# Patient Record
Sex: Female | Born: 1975 | Race: White | Hispanic: Yes | Marital: Single | Smoking: Never smoker
Health system: Southern US, Community
[De-identification: ages and names within clinical notes are randomized; demographics above are authoritative.]

## PROBLEM LIST (undated history)

## (undated) DIAGNOSIS — F419 Anxiety disorder, unspecified: Secondary | ICD-10-CM

## (undated) DIAGNOSIS — T7840XA Allergy, unspecified, initial encounter: Secondary | ICD-10-CM

## (undated) DIAGNOSIS — F329 Major depressive disorder, single episode, unspecified: Secondary | ICD-10-CM

## (undated) DIAGNOSIS — F32A Depression, unspecified: Secondary | ICD-10-CM

## (undated) DIAGNOSIS — J45909 Unspecified asthma, uncomplicated: Secondary | ICD-10-CM

## (undated) HISTORY — DX: Depression, unspecified: F32.A

## (undated) HISTORY — DX: Major depressive disorder, single episode, unspecified: F32.9

## (undated) HISTORY — DX: Allergy, unspecified, initial encounter: T78.40XA

## (undated) HISTORY — DX: Anxiety disorder, unspecified: F41.9

## (undated) HISTORY — DX: Unspecified asthma, uncomplicated: J45.909

---

## 1999-09-25 HISTORY — PX: TUBAL LIGATION: SHX77

## 2006-07-23 ENCOUNTER — Encounter: Admission: RE | Admit: 2006-07-23 | Discharge: 2006-07-23 | Payer: Self-pay | Admitting: Family Medicine

## 2011-04-20 ENCOUNTER — Ambulatory Visit (INDEPENDENT_AMBULATORY_CARE_PROVIDER_SITE_OTHER): Payer: Self-pay | Admitting: Gynecology

## 2011-04-20 ENCOUNTER — Encounter: Payer: Self-pay | Admitting: Gynecology

## 2011-04-20 VITALS — BP 130/78 | Ht 61.75 in | Wt 161.0 lb

## 2011-04-20 DIAGNOSIS — K629 Disease of anus and rectum, unspecified: Secondary | ICD-10-CM

## 2011-04-20 DIAGNOSIS — Z124 Encounter for screening for malignant neoplasm of cervix: Secondary | ICD-10-CM

## 2011-04-20 DIAGNOSIS — K6289 Other specified diseases of anus and rectum: Secondary | ICD-10-CM

## 2011-04-20 MED ORDER — LIDOCAINE-HYDROCORTISONE ACE 3-0.5 % RE CREA: 1.0000 | TOPICAL_CREAM | Freq: Three times a day (TID) | RECTAL | Status: DC

## 2011-04-20 NOTE — Progress Notes (Signed)
Patient is a 35 year old female patient knew patient in the practice who presented to the office for a problem visit. She stated over the course of the past 6 months she's noticed this lesion in her right and she had visited in urgent care was given a cream. She is concerned afraid of it being something malignant or STD. She is in a monogamous relationship should and is having normal menstrual cycle and has had a tubal ligation the past.  Pelvic exam: Bartholin urethra Skene's glands within normal limits Normal perineum, perirectal induration/hemorrhoidal tag? With a small area of erosion. No internal rectal abnormality on digital exam. And no vaginal abnormalities noted.  The perirectal region was cleansed with Betadine solution and 1% lidocaine was applied subdermally for approximately 3 cc. With a scalpel this indurated area was excised and submitted for pathological evaluation. For hemostasis overnight and was applied followed by application of 2% Xylocaine gel.  Patient will be notified of the pathology report within one week and she'll return back to the office in 3 weeks for her annual gynecological examination. I've given her a prescription for [facility] cream to apply to 3 times a day for the next 7-10 days. She was also given a prescription for Colace to take one tablet daily as a stool softener. If she does not hear from the path report in 1 week from our office she is to call back for the results.

## 2011-04-20 NOTE — Patient Instructions (Signed)
Hacer cita para ver al Dr. Lily Peer in [place]. Applicar crema rectal 2-3 veces al dai for [employer] a diez dias. Te llamaremoscon el resultado en una semana. Si no recibes llamada de nosotros por favor llamar para el resultado.

## 2011-04-24 ENCOUNTER — Telehealth: Payer: Self-pay | Admitting: Anesthesiology

## 2011-04-24 NOTE — Telephone Encounter (Signed)
Called patient and informed that her pathology report came back negative.Marland Kitchen Her biopsy was benign.. And to remind her to keep her appointment for a follow-up check of biopsy site. All was explained in Spanish advised to call back with any additional questions.Marland Kitchen

## 2011-05-09 ENCOUNTER — Ambulatory Visit: Payer: Self-pay | Admitting: Gynecology

## 2011-05-16 ENCOUNTER — Encounter: Payer: Self-pay | Admitting: Gynecology

## 2011-05-16 ENCOUNTER — Ambulatory Visit (INDEPENDENT_AMBULATORY_CARE_PROVIDER_SITE_OTHER): Payer: Self-pay | Admitting: Gynecology

## 2011-05-16 ENCOUNTER — Other Ambulatory Visit (HOSPITAL_COMMUNITY)
Admission: RE | Admit: 2011-05-16 | Discharge: 2011-05-16 | Disposition: A | Discharge status: Home / Self Care | Payer: Self-pay | Source: Ambulatory Visit | Attending: Gynecology | Admitting: Gynecology

## 2011-05-16 VITALS — BP 118/76 | Ht 62.5 in | Wt 163.0 lb

## 2011-05-16 DIAGNOSIS — Z01419 Encounter for gynecological examination (general) (routine) without abnormal findings: Secondary | ICD-10-CM | POA: Insufficient documentation

## 2011-05-16 DIAGNOSIS — Z13220 Encounter for screening for lipoid disorders: Secondary | ICD-10-CM

## 2011-05-16 DIAGNOSIS — K6289 Other specified diseases of anus and rectum: Secondary | ICD-10-CM

## 2011-05-16 DIAGNOSIS — R823 Hemoglobinuria: Secondary | ICD-10-CM

## 2011-05-16 DIAGNOSIS — R635 Abnormal weight gain: Secondary | ICD-10-CM

## 2011-05-16 DIAGNOSIS — K629 Disease of anus and rectum, unspecified: Secondary | ICD-10-CM

## 2011-05-16 DIAGNOSIS — Z131 Encounter for screening for diabetes mellitus: Secondary | ICD-10-CM

## 2011-05-16 NOTE — Progress Notes (Signed)
Caroline Carter January 14, 1976 [id number]   History:    35 y.o.  for annual exam in for followup from her perirectal lesion that was removed in the office on 04/20/2011. 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 She is doing well otherwise. She states she's having regular menstrual cycles. She's had a previous tubal sterilization procedure. She does her monthly self breast examination. Patient has not had a baseline mammogram yet. She is complaining of weight gain as well.  Past medical history,surgical history, family history and social history were all reviewed and documented in the EPIC chart. ROS:  Was performed and pertinent positives and negatives are included in the history.  Exam: chaperone present Filed Vitals:   05/16/11 1601  BP: 118/76   @WEIGHT @ Body mass index is 29.34 kg/(m^2).  General appearance : Well developed well nourished female. Skin grossly normal HEENT: Neck supple, trachea midline Lungs: Clear to auscultation, no rhonchi or wheezes Heart: Regular rate and rhythm, no murmurs or gallops Breast:Examined in sitting and supine position were symmetrical in appearance, no palpable masses, to skin retraction, no nipple inversion, no nipple discharge and no axillary or supraclavicular lymphadenopathy Abdomen: no palpable masses or tenderness Pelvic  Ext/BUS/vagina  normal   Cervix  normal   Uterus  anteverted, normal size, shape and contour, midline and mobile nontender   Adnexa  Without masses or tenderness  Anus and perineum  normal   Rectovaginal  area of previous perirectal lesion excision site completely healed.            Hemoccult not done     Assessment/Plan:  35 y.o. female for annual exam with no abnormal findings. Patient was instructed to do her monthly self breast examination. Due to limited financial resources we have given her information on  the [facility] for mamm[facility]. We will draw her CBC screening  cholesterol TSH random blood sugar urinalysis and Pap smear. Will notify her if there is any abnormality of any the above-mentioned tests otherwise will see her back in one year or when necessary.   Ok Edwards MD, 5:04 PM 05/16/2011

## 2011-05-16 NOTE — Progress Notes (Signed)
Addended byCammie Mcgee T on: 05/16/2011 05:14 PM   Modules accepted: Orders

## 2011-06-29 ENCOUNTER — Other Ambulatory Visit: Payer: Self-pay | Admitting: Gynecology

## 2011-06-29 DIAGNOSIS — Z1231 Encounter for screening mammogram for malignant neoplasm of breast: Secondary | ICD-10-CM

## 2011-07-11 ENCOUNTER — Ambulatory Visit (HOSPITAL_COMMUNITY)
Admission: RE | Admit: 2011-07-11 | Discharge: 2011-07-11 | Disposition: A | Discharge status: Home / Self Care | Payer: Self-pay | Source: Ambulatory Visit | Attending: Gynecology | Admitting: Gynecology

## 2011-07-11 DIAGNOSIS — Z1231 Encounter for screening mammogram for malignant neoplasm of breast: Secondary | ICD-10-CM

## 2011-12-18 ENCOUNTER — Ambulatory Visit (INDEPENDENT_AMBULATORY_CARE_PROVIDER_SITE_OTHER): Payer: Self-pay | Admitting: Gynecology

## 2011-12-18 ENCOUNTER — Encounter: Payer: Self-pay | Admitting: Gynecology

## 2011-12-18 VITALS — BP 126/82 | Temp 97.2°F

## 2011-12-18 DIAGNOSIS — J069 Acute upper respiratory infection, unspecified: Secondary | ICD-10-CM

## 2011-12-18 MED ORDER — AMOXICILLIN 500 MG PO CAPS: 500.0000 mg | ORAL_CAPSULE | Freq: Two times a day (BID) | ORAL | Status: AC

## 2011-12-18 NOTE — Progress Notes (Signed)
Patient's a 36 year old who presented to the office today complaining of sore throat sinus pressure coughing with plan and she is even noticed blood when coughing. She denied any fever or ear pain sore throat for the past few days. She tried DayQuil with minimal resolution. She denies any other family members sick at the present time.  Exam: Blood pressure 126/82 temperature 97.2 HEENT: Unremarkable Lungs: Some expiratory rhonchi on lung bases no rales or wheezes   Assessment/plan: Patient with very limited financial resources we'll treat for suspected bronchitis with amoxicillin 500 mg twice a day for 10 days along with Mucinex maximum strength one tablet every 12 hours. Patient was counseled that if no improvement her symptoms that she will need to return to the office so we can schedule a chest x-ray PA and lateral along with a TB tine test. She refused a chest x-ray today. All the above was explained in Spanish and we'll follow accordingly.

## 2011-12-18 NOTE — Patient Instructions (Signed)
Bronquitis (Bronchitis) La bronquitis es una inflamacin (el modo que tiene el organismo de reaccionar a una lesin o infeccin) de los bronquios Los bronquios son los conductos que se extienden desde la trquea hasta los pulmones. Si la inflamacin se agrava, puede causar la falta de aire. CAUSAS Las causas de la inflamacin pueden ser:  Un virus   Grmenes (bacteria).   Polvo   Alergenos   La polucin y muchos otros irritantes  Las clulas que revisten el rbol bronquial estn cubiertas con pequeos pelos (cilias). Esta constantemente producen un movimiento desde los pulmones hacia la boca. De este modo se mantienen los pulmones libres de polucin. Cuando estas clulas se irritan y no pueden cumplir su funcin, comienza a formarse la mucosidad. Esto produce la caracterstica tos de la bronquitis. La tos es el mecanismo por el cual se limpian los pulmones cuando las cilias no pueden cumplir su funcin. Sin alguno de estos mecanismos protectores, el material se acumulara en los pulmones Entonces se desarrollara una pulmona.  El fumar es una de las causas ms frecuentes de bronquitis y puede contribuir a la neumona. Abandonar este hbito es lo ms importante que puede hacer para beneficiarse. TRATAMIENTO  El mdico le prescribir antibiticos si la causa es una bacteria, y medicamentos para abrir las vas areas y facilitar la respiracin. Tambin puede recomendar o prescribir un expectorante. El expectorante aflojar la mucosidad para que pueda eliminarla. Slo tome medicamentos de venta libre o prescriptos para calmar el dolor, las molestias, o bajar la fiebre segn las indicaciones de su mdico.   Eliminar todo lo que causa el problema (por ejemplo el hbito de fumar) es fundamental para evitar que empeore.   Un antitusgeno puede prescribirse para aliviar los sntomas de la tos.   Podrn indicarle inhalantes para aliviar los sntomas actuales y ayudar a prevenir problemas futuros.    Aquellos que sufren bronquitis crnica (recurrente) puede ser necesaria la administracin de corticoides.  SOLICITE ATENCIN MDICA INMEDIATAMENTE SI:  Durante el tratamiento observa que elimina esputo similar a pus (purulento).   Tiene fiebre.   Su beb tiene ms de 3 meses y su temperatura rectal es de 102 F (38.9 C) o ms.   Su beb tiene 3 meses o menos y su temperatura rectal es de 100.4 F (38 C) o ms.   Se siente cada vez ms enfermo.   Tiene cada vez ms dificultad para respirar, tiene ruidos al respirar o le falta el aire.  Es necesario buscar atencin mdica inmediata si es una persona de edad avanzada o sufre alguna otra enfermedad. ASEGURESE DE QUE:   Comprende estas instrucciones.   Controlar su enfermedad.   Solicitar ayuda inmediatamente si no mejora o si empeora.  Document Released: 09/10/2005 Document Revised: 08/30/2011 ExitCare Patient Information 2012 [employer]. 

## 2012-08-23 IMAGING — MG MM DIGITAL SCREENING BILAT
4 series · 4 of 4 positions shown · non-contrast
Comparison: none

DG SCHOLARSHIP MAMMOGRAM
Bilateral CC and MLO view(s) were taken.
Technologist: Serbantes, David Efrain.(NOMASIBULELE)(M)

DIGITAL SCREENING MAMMOGRAM WITH CAD:
The breast tissue is heterogeneously dense.  No masses or malignant type calcifications are 
identified.
Images were processed with CAD.

[R CC]
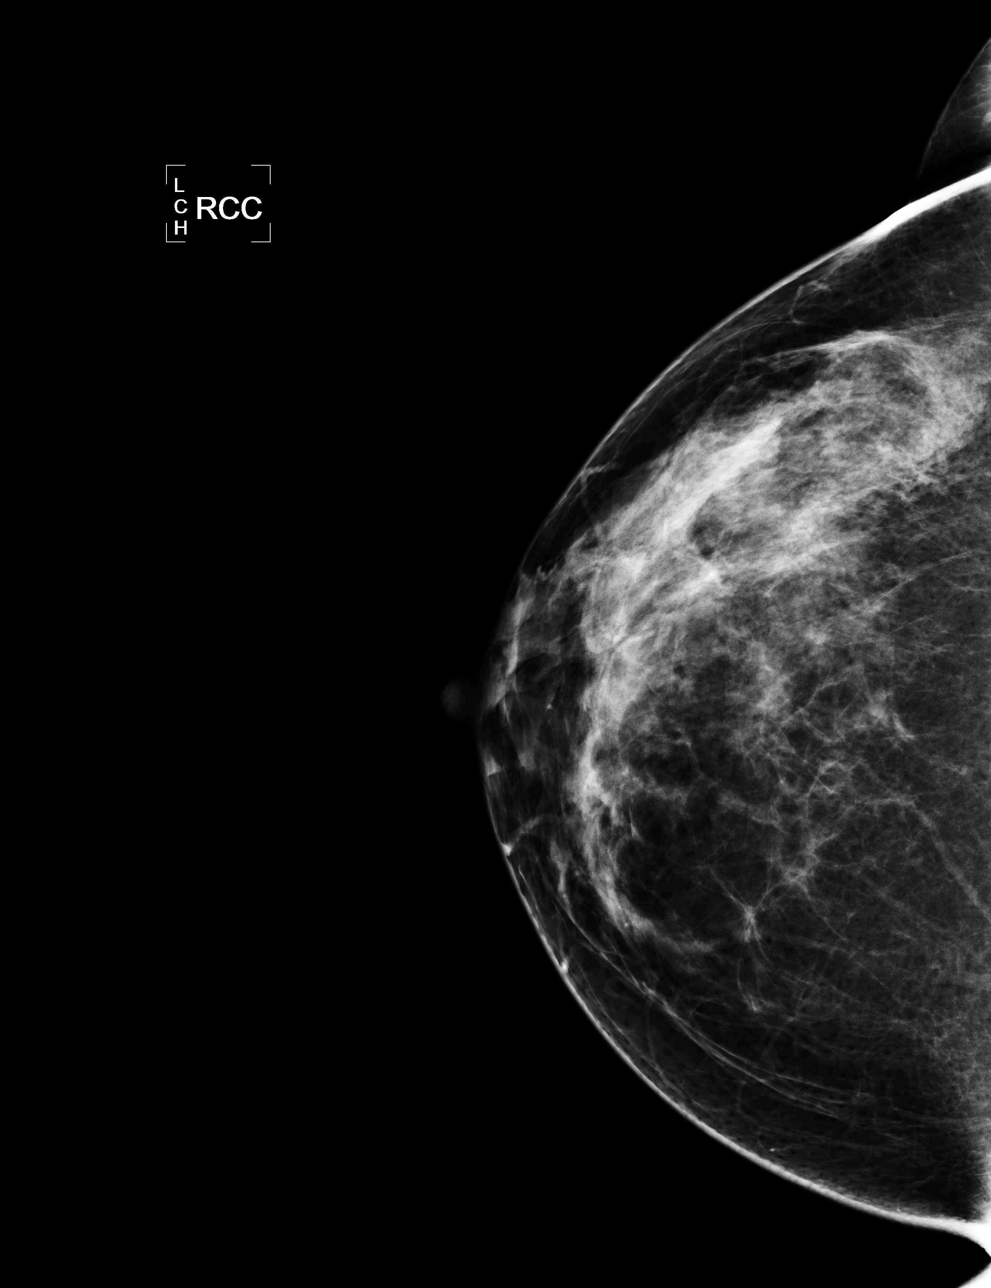

[R MLO]
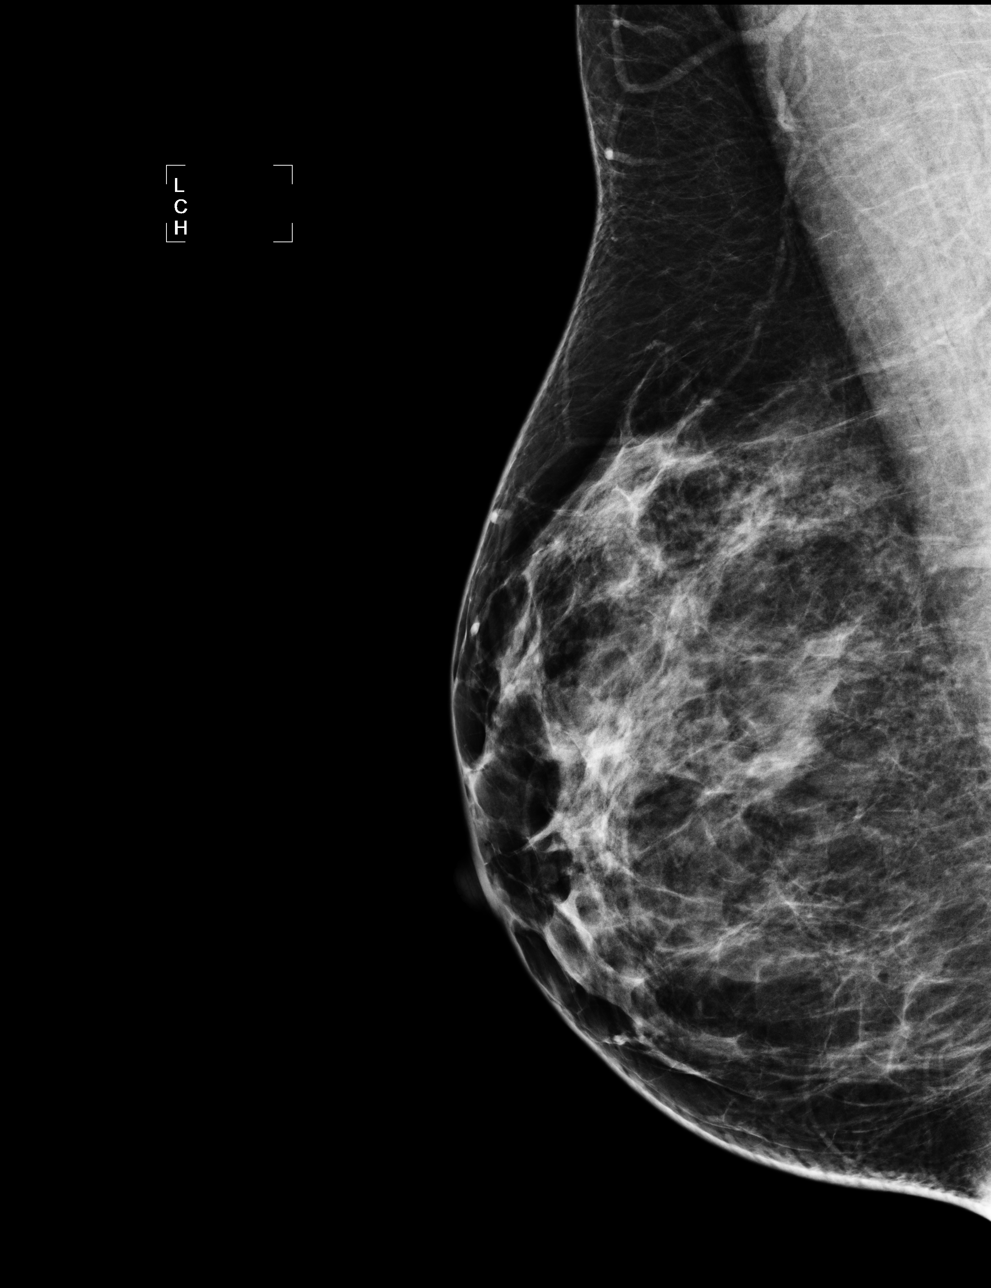

[L CC]
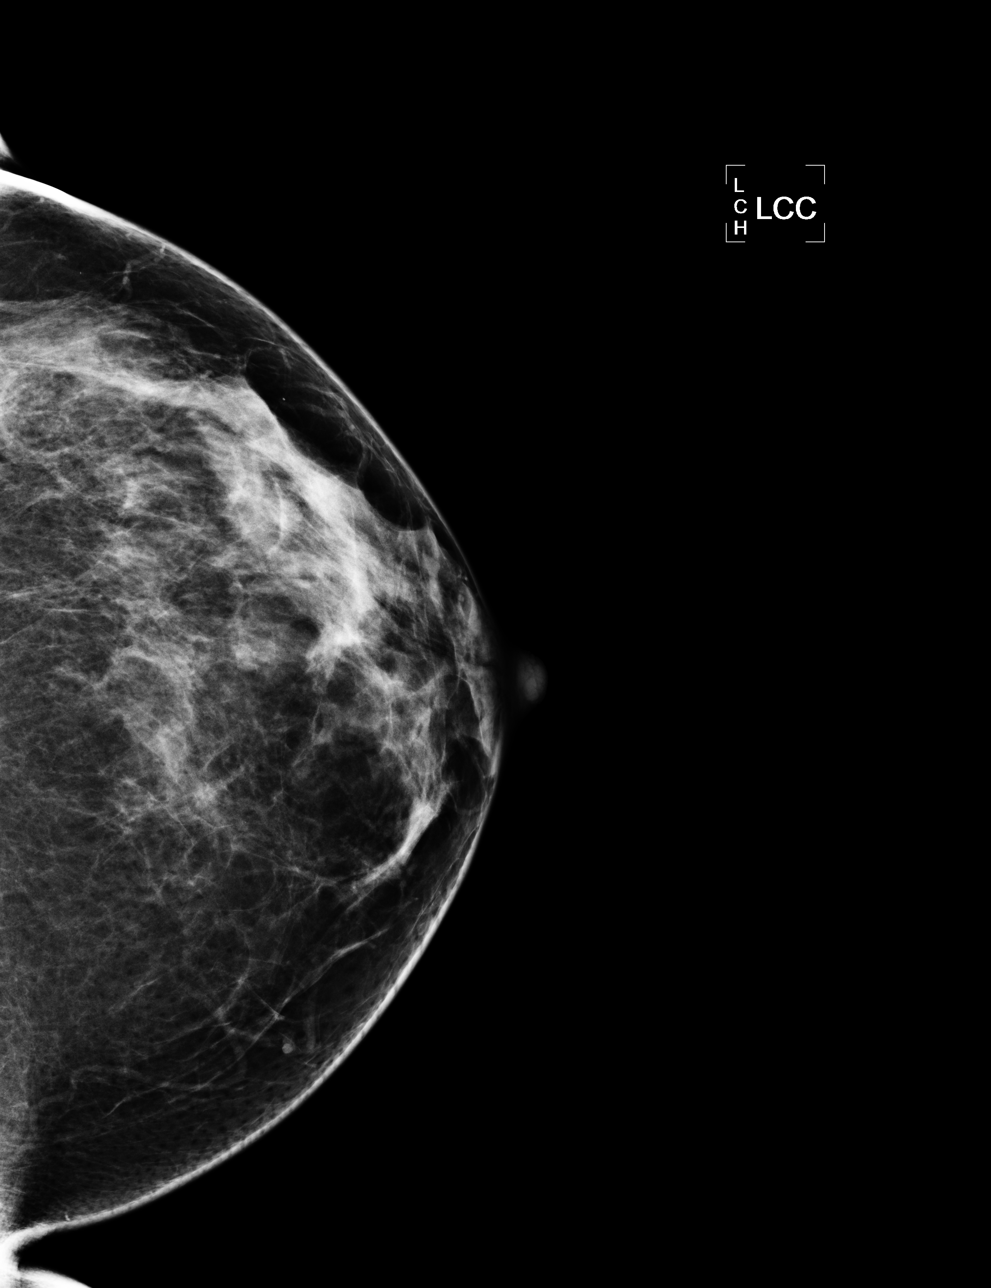

[L MLO]
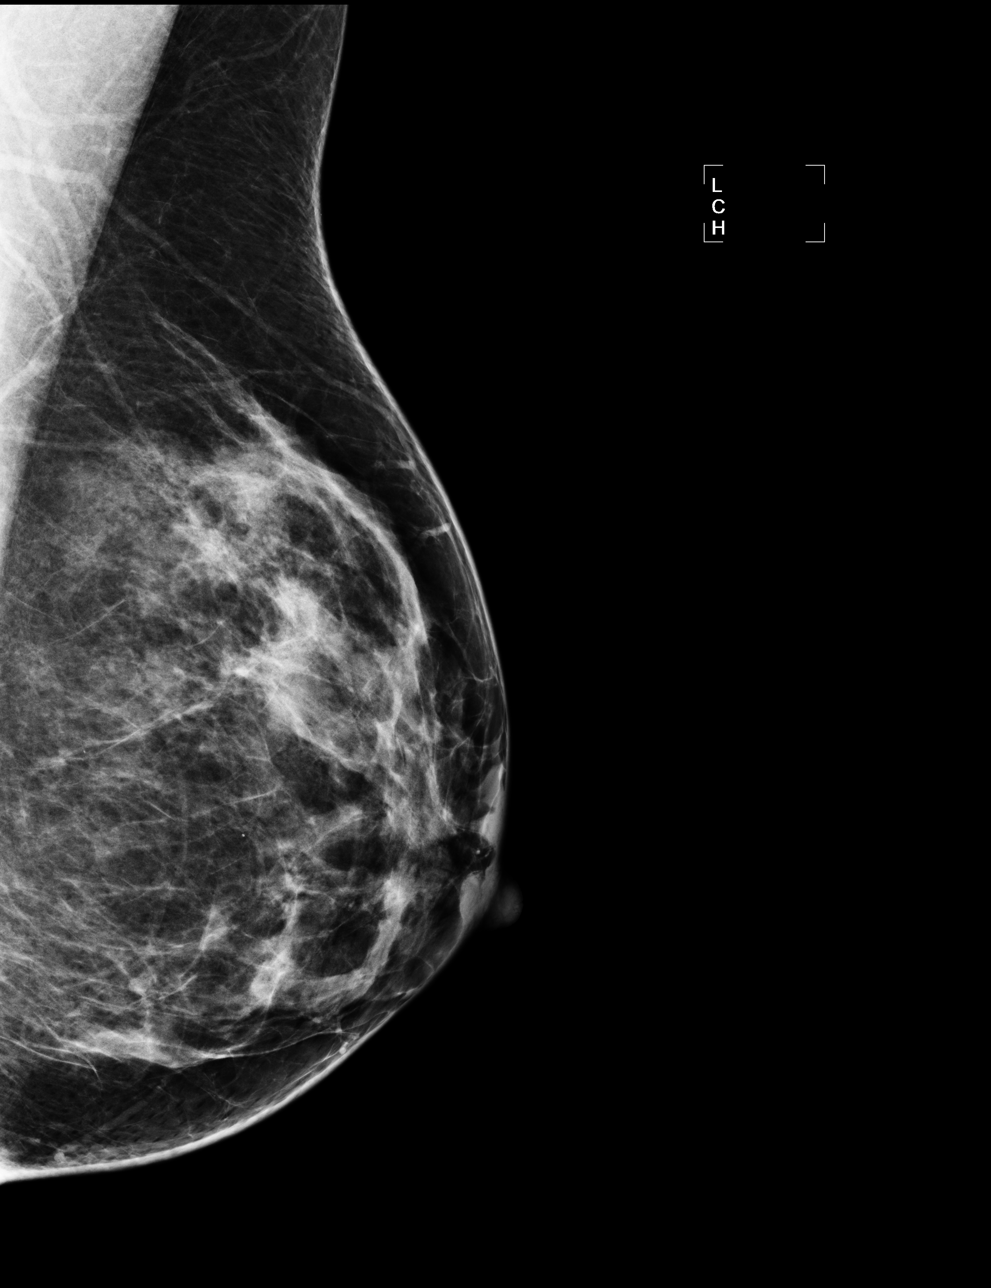

[4 of 4 positions shown; findings below may reference images not displayed]

IMPRESSION: No specific mammographic evidence of malignancy.  Next screening mammogram is recommended at age 
39.

A result letter of this screening mammogram will be mailed directly to the patient.

ASSESSMENT: Negative - BI-RADS 1

Screening mammogram at age 39.
,

## 2014-01-30 ENCOUNTER — Ambulatory Visit: Payer: Self-pay | Admitting: Family Medicine

## 2014-01-30 VITALS — BP 114/70 | HR 80 | Temp 98.2°F | Resp 12 | Ht 61.5 in | Wt 155.6 lb

## 2014-01-30 DIAGNOSIS — R059 Cough, unspecified: Secondary | ICD-10-CM

## 2014-01-30 DIAGNOSIS — J302 Other seasonal allergic rhinitis: Secondary | ICD-10-CM

## 2014-01-30 DIAGNOSIS — Z8709 Personal history of other diseases of the respiratory system: Secondary | ICD-10-CM

## 2014-01-30 DIAGNOSIS — J309 Allergic rhinitis, unspecified: Secondary | ICD-10-CM

## 2014-01-30 DIAGNOSIS — J209 Acute bronchitis, unspecified: Secondary | ICD-10-CM

## 2014-01-30 DIAGNOSIS — R05 Cough: Secondary | ICD-10-CM

## 2014-01-30 MED ORDER — AMOXICILLIN 500 MG PO CAPS: 500.0000 mg | ORAL_CAPSULE | Freq: Three times a day (TID) | ORAL | Status: DC

## 2014-01-30 MED ORDER — HYDROCODONE-HOMATROPINE 5-1.5 MG/5ML PO SYRP: 5.0000 mL | ORAL_SOLUTION | Freq: Every evening | ORAL | Status: DC | PRN

## 2014-01-30 MED ORDER — ALBUTEROL SULFATE HFA 108 (90 BASE) MCG/ACT IN AERS: 2.0000 | INHALATION_SPRAY | Freq: Four times a day (QID) | RESPIRATORY_TRACT | Status: DC | PRN

## 2014-01-30 NOTE — Progress Notes (Deleted)
   Subjective:    Patient ID: Marisa SprinklesMartha Odonoghue, female    DOB: 10/15/1975, 38 y.o.   MRN: [id number]  HPI 38 year old female presents to [facility] with:   Cough x 2 weeks; productive cough, yellow sputum, complains of SOB, fever yesterday, sweating, wheezing  Sore throat x 2 weeks  nasal congestion x 2 weeks, white and yellow in color, sneezing, headaches, eyes burning   Tried benedryl OTC and "asthma" pills History of seasonal allergy induced asthma   Non smoker  Review of Systems     Objective:   Physical Exam        Assessment & Plan:

## 2014-01-30 NOTE — Progress Notes (Signed)
 Chief Complaint:  Chief Complaint  Patient presents with  . Allergic Rhinitis     cough  with yellow sputum, nasal congestion x 2 weeks.      HPI: Caroline Carter is a 38 y.o. female who is here for   Allergies and URI sxs Cough x 2 weeks; productive cough, yellow sputum, complains of SOB, fever yesterday, sweating, wheezing Sore throat x 2 weeks nasal congestion x 2 weeks, white and yellow in color, sneezing, headaches, eyes burning Tried benedryl OTC and "asthma" pills History of seasonal allergy induced asthma  Past Medical History  Diagnosis Date  . Depression   . Anxiety   . Allergy   . Asthma    Past Surgical History  Procedure Laterality Date  . Tubal ligation  2001   History   Social History  . Marital Status: Single    Spouse Name: N/A    Number of Children: N/A  . Years of Education: N/A   Social History Main Topics  . Smoking status: Never Smoker   . Smokeless tobacco: Never Used  . Alcohol Use: No  . Drug Use: No  . Sexual Activity: Yes    Birth Control/ Protection: Surgical   Other Topics Concern  . None   Social History Narrative  . None   Family History  Problem Relation Age of Onset  . Diabetes Maternal Grandmother    No Known Allergies Prior to Admission medications   Medication Sig Start Date End Date Taking? Authorizing Provider  diphenhydrAMINE (BENADRYL) 25 MG tablet Take 25 mg by mouth 2 (two) times daily.   Yes Historical Provider, MD  Ephedrine-Guaifenesin (PRIMATENE ASTHMA) 12.5-200 MG TABS Take 2 tablets by mouth every 4 (four) hours.   Yes Historical Provider, MD  albuterol (PROVENTIL HFA;VENTOLIN HFA) 108 (90 BASE) MCG/ACT inhaler Inhale 2 puffs into the lungs every 6 (six) hours as needed for wheezing or shortness of breath. 01/30/14    P , DO  amoxicillin (AMOXIL) 500 MG capsule Take 1 capsule (500 mg total) by mouth 3 (three) times daily. 01/30/14    P , DO  HYDROcodone-homatropine (HYCODAN) 5-1.5 MG/5ML  syrup Take 5 mLs by mouth at bedtime as needed for cough. 01/30/14    P , DO  lidocaine-hydrocortisone (ANAMANTEL HC) 3-0.5 % CREA Place 1 Applicatorful rectally 3 (three) times daily. 04/20/11   Ok EdwardsJuan H Fernandez, MD     ROS: The patient denies fevers, chills, night sweats, unintentional weight loss, chest pain, palpitations, wheezing, dyspnea on exertion, nausea, vomiting, abdominal pain, dysuria, hematuria, melena, numbness, weakness, or tingling.   All other systems have been reviewed and were otherwise negative with the exception of those mentioned in the HPI and as above.    PHYSICAL EXAM: Filed Vitals:   01/30/14 1317  BP: 114/70  Pulse: 80  Temp: 98.2 F (36.8 C)  Resp: 12   Filed Vitals:   01/30/14 1317  Height: 5' 1.5" (1.562 m)  Weight: 155 lb 9.6 oz (70.58 kg)   Body mass index is 28.93 kg/(m^2).  General: Alert, no acute distress HEENT:  Normocephalic, atraumatic, oropharynx patent. EOMI, PERRLA. TM nl, NO exudates, + nasal bogginess, nasal congestion, + sinus tendernss Cardiovascular:  Regular rate and rhythm, no rubs murmurs or gallops.  No Carotid bruits, radial pulse intact. No pedal edema.  Respiratory: Clear to auscultation bilaterally.  No wheezes, rales, or rhonchi.  No cyanosis, no use of accessory musculature GI: No organomegaly, abdomen is soft and non-tender,  positive bowel sounds.  No masses. Skin: No rashes. Neurologic: Facial musculature symmetric. Psychiatric: Patient is appropriate throughout our interaction. Lymphatic: No cervical lymphadenopathy Musculoskeletal: Gait intact.   LABS: No results found for this or any previous visit.   EKG/XRAY:   Primary read [facility].   ASSESSMENT/PLAN: Encounter Diagnoses  Name Primary?  . Acute bronchitis Yes  . Cough   . Seasonal allergies   . History of asthma    Rx amoxacillin 500 mg TID Rx Hycodan Otc claritn or zyrtec Rx albuterol Sample of nasonex given  Gross  sideeffects, risk and benefits, and alternatives of medications d/w patient. Patient is aware that all medications have potential sideeffects and we are unable to predict every sideeffect or drug-drug interaction that may occur.  nell Antuhao P , DO 01/30/2014 2:48 PM

## 2014-01-30 NOTE — Patient Instructions (Signed)
Bronchitis Bronchitis is inflammation of the airways that extend from the windpipe into the lungs (bronchi). The inflammation often causes mucus to develop, which leads to a cough. If the inflammation becomes severe, it may cause shortness of breath. CAUSES  Bronchitis may be caused by:   Viral infections.   Bacteria.   Cigarette smoke.   Allergens, pollutants, and other irritants.  SIGNS AND SYMPTOMS  The most common symptom of bronchitis is a frequent cough that produces mucus. Other symptoms include:  Fever.   Body aches.   Chest congestion.   Chills.   Shortness of breath.   Sore throat.  DIAGNOSIS  Bronchitis is usually diagnosed through a medical history and physical exam. Tests, such as chest X-rays, are sometimes done to rule out other conditions.  TREATMENT  You may need to avoid contact with whatever caused the problem (smoking, for example). Medicines are sometimes needed. These may include:  Antibiotics. These may be prescribed if the condition is caused by bacteria.  Cough suppressants. These may be prescribed for relief of cough symptoms.   Inhaled medicines. These may be prescribed to help open your airways and make it easier for you to breathe.   Steroid medicines. These may be prescribed for those with recurrent (chronic) bronchitis. HOME CARE INSTRUCTIONS  Get plenty of rest.   Drink enough fluids to keep your urine clear or pale yellow (unless you have a medical condition that requires fluid restriction). Increasing fluids may help thin your secretions and will prevent dehydration.   Only take over-the-counter or prescription medicines as directed by your health care provider.  Only take antibiotics as directed. Make sure you finish them even if you start to feel better.  Avoid secondhand smoke, irritating chemicals, and strong fumes. These will make bronchitis worse. If you are a smoker, quit smoking. Consider using nicotine gum or  skin patches to help control withdrawal symptoms. Quitting smoking will help your lungs heal faster.   Put a cool-mist humidifier in your bedroom at night to moisten the air. This may help loosen mucus. Change the water in the humidifier daily. You can also run the hot water in your shower and sit in the bathroom with the door closed for 5 10 minutes.   Follow up with your health care provider as directed.   Wash your hands frequently to avoid catching bronchitis again or spreading an infection to others.  SEEK MEDICAL CARE IF: Your symptoms do not improve after 1 week of treatment.  SEEK IMMEDIATE MEDICAL CARE IF:  Your fever increases.  You have chills.   You have chest pain.   You have worsening shortness of breath.   You have bloody sputum.  You faint.  You have lightheadedness.  You have a severe headache.   You vomit repeatedly. MAKE SURE YOU:   Understand these instructions.  Will watch your condition.  Will get help right away if you are not doing well or get worse. Document Released: 09/10/2005 Document Revised: 07/01/2013 Document Reviewed: 05/05/2013 [facility] Patient Information 2014 [place], [place]. Sinusitis  (Sinusitis) La sinusitis es el enrojecimiento, sensibilidad e hinchazn (inflamacin) de los senos paranasales. Los senos paranasales son bolsas de aire que se encuentran dentro de los huesos de la cara (por debajo de los ojos, en la mitad de la frente o por encima de los ojos). En los senos paranasales sanos, el moco puede drenar y el aire circula a travs de ellos en su camino hacia la Clinical cytogeneticist. Sin embargo, cuando se  inflaman, el moco y el aire [place] atrapados. Esto hace que se desarrollen bacterias y otros grmenes y originen una infeccin.   La sinusitis puede desarrollarse rpidamente y durar slo un tiempo corto (aguda) o continuar por un perodo largo (crnica). La sinusitis que dura ms de 12 semanas se considera crnica.  CAUSAS  Las  causas de la sinusitis son:   Development worker, communityAlergias  Las anomalas estructurales, como el desplazamiento del cartlago que separa las fosas nasales (desvo del tabique) pueden disminuir el flujo de aire por la nariz y los senos paranasales y Audiological scientistafectar su drenaje.  Las alteraciones funcionales, como cuando los pequeos pelos (cilias) que se encuentran en los senos nasales y que ayudan a eliminar la mucosidad no funcionan correctamente o no estn presentes. SNTOMAS  Los sntomas de sinusitis aguda y crnica son los mismos. Los sntomas principales son Chief Technology Officerel dolor y la presin alrededor de los senos paranasales afectados. Otros sntomas son:   Careers information officerDolor en los dientes superiores.  Dolor de odos.  Dolor de Turkmenistancabeza.  Mal aliento.  Disminucin del sentido del olfato y del gusto.  Tos, que empeora al D.R. Horton, Incacostarse.  Fatiga.  Grant RutsFiebre.  Drenaje de moco espeso por la nariz, que generalmente es de color verde y puede contener pus (purulento).  Hinchazn y calor en los senos paranasales afectados. DIAGNSTICO  El mdico le har un examen fsico. Durante el examen, el mdico:   Revisar su nariz buscando signos de crecimientos anormales en las fosas nasales (plipos nasales).  Palpar los senos paranasales afectados para buscar signos de infeccin.  Observar el interior de los senos paranasales (endoscopa) con un dispositivo especial que emite luz (endoscopio) colocndolo dentro de los senos paranasales. Si el mdico sospecha que usted sufre sinusitis crnica, podr indicar una o ms de las siguientes pruebas:   Pruebas de Programmer, multimediaalergia.  Cultivo de secreciones nasales: tomar Lauris Poaguna muestra del moco nasal y la enviar a un laboratorio para detectar bacterias.  Citologa nasal: el mdico tomar [place]una muestra de moco de la nariz para determinar si la sinusitis que usted sufre est relacionada con Vella Raringuna alergia. TRATAMIENTO  La mayora de los casos de sinusitis aguda se deben a una infeccin viral y se resuelven  espontneamente dentro de los [address] das. En algunos casos se recetan medicamentos para Asbury Automotive Groupaliviar los sntomas (analgsicos, descongestivos, aerosoles nasales con corticoides o aerosoles salinos).  Sin embargo, para la sinusitis por infeccin bacteriana, Chief Operating Officerel mdico le recetar antibiticos. Los antibiticos son medicamentos que destruyen las bacterias que causan la infeccin.  Rara vez la sinusitis tiene su origen en una infeccin por hongos. En estos casos, Actorel mdico le recetar un medicamento antifngico.  Para algunos casos de sinusitis crnica, es necesario someterse a [place] Herz[place]inauna ciruga. Generalmente se trata de Engelhard Corporationcasos en los que la sinusitis se repite ms de 3 veces al ao, a pesar de otros tratamientos.  INSTRUCCIONES PARA EL CUIDADO EN EL HOGAR   Tiene que beber gran cantidad de agua. Los lquidos ayudan a Optometristdisolver el moco para que drene ms fcilmente de los senos paranasales.  Use un humidificador.  Inhale vapor de 3 a 4 veces al da (por ejemplo, sintese en el bao con la ducha abierta).  Aplique un pao tibio y hmedo en su cara 3  4 veces al da, o segn las indicaciones de su mdico.  Use un aerosol nasal salino para ayudar a Environmental education officerhumedecer y Duke Energylimpiar los senos nasales.  Tome medicamentos de venta libre o recetados para Acupuncturistel aliviar el dolor, Environmental health practitionerel malestar o  la fiebre slo segn las indicaciones de su mdico. SOLICITE ATENCIN MDICA DE INMEDIATO SI:   Siente ms dolor o sufre dolores de cabeza intensos.  Tiene nuseas, vmitos o somnolencia.  Observa hinchazn alrededor del rostro.  Tiene problemas de visin.  Presenta rigidez en el cuello.  Tiene dificultad para respirar. ASEGRESE DE QUE:   Comprende estas instrucciones.  Controlar su enfermedad.  Recibir ayuda de inmediato si no mejora o si empeora. Document Released: 06/20/2005 Document Revised: 05/13/2013 [facility] Patient Information 2014 [place], [place].

## 2016-09-27 ENCOUNTER — Encounter: Payer: Self-pay | Admitting: Gynecology

## 2016-09-27 ENCOUNTER — Ambulatory Visit (INDEPENDENT_AMBULATORY_CARE_PROVIDER_SITE_OTHER): Payer: Self-pay | Admitting: Gynecology

## 2016-09-27 VITALS — BP 134/86 | Ht 62.0 in | Wt 167.0 lb

## 2016-09-27 DIAGNOSIS — Z01411 Encounter for gynecological examination (general) (routine) with abnormal findings: Secondary | ICD-10-CM

## 2016-09-27 DIAGNOSIS — R350 Frequency of micturition: Secondary | ICD-10-CM

## 2016-09-27 DIAGNOSIS — M5440 Lumbago with sciatica, unspecified side: Secondary | ICD-10-CM | POA: Insufficient documentation

## 2016-09-27 LAB — URINALYSIS W MICROSCOPIC + REFLEX CULTURE
Bilirubin Urine: NEGATIVE
CASTS: NONE SEEN [LPF]
CRYSTALS: NONE SEEN [HPF]
Glucose, UA: NEGATIVE
HGB URINE DIPSTICK: NEGATIVE
Ketones, ur: NEGATIVE
LEUKOCYTES UA: NEGATIVE
Nitrite: NEGATIVE
PH: 5.5 (ref 5.0–8.0)
PROTEIN: NEGATIVE
RBC / HPF: NONE SEEN RBC/HPF (ref <=2)
Specific Gravity, Urine: 1.02 (ref 1.001–1.035)
WBC, UA: NONE SEEN WBC/HPF (ref <=5)
YEAST: NONE SEEN [HPF]

## 2016-09-27 NOTE — Patient Instructions (Signed)
Citica (Sciatica) La citica es el dolor, entumecimiento, debilidad u hormigueo a lo largo del nervio citico. El nervio citico comienza en la parte inferior de la espalda y desciende por la parte posterior de cada pierna. Controla los msculos en la parte inferior de las piernas y en la parte posterior de las rodillas. Tambin otorga sensibilidad a la parte posterior de los muslos, la parte inferior de las piernas y la planta de los pies. La citica es un sntoma de otra afeccin que ejerce presin o "pellizca" el nervio citico. Generalmente la citica afecta slo un lado del cuerpo. Suele desaparecer por s sola o con tratamiento. En algunos casos, la citica puede volver a aparecer . CAUSAS Esta afeccin causa presin sobre el nervio citico o lo "pellizca". Esto puede ser el resultado de:  Un disco que sobresale demasiado (hernia de disco) entre los huesos de la columna vertebral (vrtebras).  Cambios relacionados con la edad en los discos de la columna vertebral (discopata degenerativa).  Un trastorno doloroso que afecta un msculo de los glteos (sndrome piriforme).  Un crecimiento seo adicional (espoln seo) cerca del nervio citico.  Una lesin o fractura de la pelvis.  Embarazo.  Tumor (poco frecuente). FACTORES DE RIESGO Los siguientes factores pueden hacer que usted sea propenso a sufrir esta afeccin:  Practicar deportes en los que se ejerce presin sobre la columna vertebral o en los que la columna realiza mucho esfuerzo, como el ftbol americano o el levantamiento de pesas.  Tener poca fuerza y flexibilidad.  Antecedentes mdicos de lesiones en la espalda.  Antecedentes mdicos de ciruga en la espalda.  Estar sentado durante largos perodos.  Realizar actividades que requieren agacharse o levantar objetos en forma repetida.  Obesidad. SNTOMAS Los sntomas pueden ser leves o graves, y pueden incluir los siguientes:  Cualquiera de los siguientes problemas  en la parte inferior de la espalda, piernas, cadera o glteos: ? Hormigueo leve o dolor sordo. ? Sensacin de ardor. ? Dolor agudo.  Adormecimiento de la parte posterior de la pantorrilla o la planta del pie.  Debilidad en las piernas.  Dolor de espalda intenso que dificulta el movimiento. Estos sntomas podran empeorar al toser, estornudar o rerse, o cuando se est sentado o de pie durante perodos prolongados. El sobrepeso tambin puede empeorar los sntomas. En algunos casos, los sntomas regresan luego de un tiempo. DIAGNSTICO Esta afeccin se puede diagnosticar en funcin de lo siguiente:  Sus sntomas.  Un examen fsico. El mdico podra indicarle que realice ciertos movimientos para controlar si estos desencadenan los sntomas.  Tambin pueden hacerle exmenes que incluyen lo siguiente: ? Anlisis de sangre. ? Radiografas. ? Resonancia magntica (RM). ? Tomografa computarizada (TC). TRATAMIENTO En muchos casos, esta afeccin mejora por s sola, sin ningn tratamiento. Sin embargo, el tratamiento puede incluir lo siguiente:  Reduccin o modificacin de la actividad fsica en los perodos de dolor.  Ejercicios y estiramiento para fortalecer el abdomen y mejorar la flexibilidad de la columna vertebral.  Aplicacin de calor o hielo en la zona afectada.  Medicamentos para lo siguiente: ? Aliviar el dolor y la inflamacin. ? Relajar los msculos.  Medicamentos inyectables que ayudan a aliviar el dolor, la irritacin y la inflamacin alrededor del nervio citico (esteroides).  Ciruga. INSTRUCCIONES PARA EL CUIDADO EN EL HOGAR Medicamentos  Tome los medicamentos de venta libre y los recetados solamente como se lo haya indicado el mdico.  No conduzca ni opere maquinaria pesada mientras toma analgsicos recetados. Control del dolor    Si se lo indican, aplique hielo en la zona afectada. ? Ponga el hielo en una bolsa plstica. ? Coloque una toalla entre la piel y la  bolsa de hielo. ? Coloque el hielo durante 20 minutos, 2 a 3 veces por da.  Despus del hielo, aplique calor sobre la zona afectada antes de realizar ejercicio o con la frecuencia que le haya indicado el mdico. Use la fuente de calor que el mdico le recomiende, como una compresa de calor hmedo o una almohadilla trmica. ? Coloque una toalla entre la piel y la fuente de calor. ? Aplique el calor durante 20 a 30minutos. ? Retire la fuente de calor si la piel se le pone de color rojo brillante. Esto es muy importante si no puede sentir el dolor, el calor o el fro. Puede correr un riesgo mayor de sufrir quemaduras. Actividad  Reanude sus actividades normales como se lo haya indicado el mdico. Pregntele al mdico qu actividades son seguras para usted. ? Evite las actividades que empeoran los sntomas.  Durante el da, descanse durante lapsos breves. Descansar recostado o de pie suele ser mejor que hacerlo sentado. ? Cuando descanse durante perodos ms largos, incorpore alguna actividad suave o ejercicios de elongacin entre perodos. Esto ayudar a evitar la rigidez y el dolor. ? Evite estar sentado durante largos perodos sin moverse. Levntese y muvase al menos una vez cada hora.  Haga ejercicio y elongue habitualmente, como se lo haya indicado el mdico.  No levante nada que pese ms de 10libras (4,5kg) mientras tenga sntomas de citica. Aunque no tenga sntomas, evite levantar objetos pesados, en especial en forma repetida.  Siempre use las tcnicas de levantamiento correctas para levantar objetos, entre ellas: ? Flexionar las rodillas. ? Mantener la carga cerca del cuerpo. ? No torcerse. Instrucciones generales  Mantenga una buena postura. ? Evite reclinarse hacia adelante cuando est sentado. ? Evite encorvar la espalda mientras est de pie.  Mantenga un peso saludable. El exceso de peso ejerce presin adicional sobre la espalda y hace que resulte difcil mantener una  buena postura.  Use calzado con buen apoyo y cmodo. Evite usar tacones.  Evite dormir sobre un colchn que sea demasiado blando o demasiado duro. Un colchn que ofrezca un apoyo suficientemente firme para su espalda al dormir puede ayudar a aliviar el dolor.  Concurra a todas las visitas de control como se lo haya indicado el mdico. Esto es importante. SOLICITE ATENCIN MDICA SI:  El dolor lo despierta cuando est dormido.  El dolor empeora cuando se acuesta.  El dolor es peor del que experiment en el pasado.  Los sntomas duran ms de 4 semanas.  Pierde peso en forma inexplicable.  SOLICITE ATENCIN MDICA DE INMEDIATO SI:  Pierde el control de la vejiga o del intestino (incontinencia).  Tiene los siguientes sntomas: ? Debilidad que empeora en la parte inferior de la espalda, la pelvis, los glteos o las piernas. ? Enrojecimiento o inflamacin en la espalda. ? Sensacin de ardor al orinar.  Esta informacin no tiene como fin reemplazar el consejo del mdico. Asegrese de hacerle al mdico cualquier pregunta que tenga. Document Released: 09/10/2005 Document Revised: 01/02/2016 Document Reviewed: 05/20/2015 Elsevier Interactive Patient Education  2017 Elsevier Inc.  

## 2016-09-27 NOTE — Progress Notes (Signed)
Caroline Carter 21-Aug-1976 [id number]   History:    41 y.o.  for annual gyn exam who has not been seen the office since 2012. Patient works in housekeeping. She's complained of low back discomfort sometimes radiating to both legs that reports no foot drop and no neuropathy. 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 She reports normal menstrual cycles. Her last mammogram was in 2012. Patient with no previous history of any abnormal Pap smears.  Past medical history,surgical history, family history and social history were all reviewed and documented in the EPIC chart.  Gynecologic History Patient's last menstrual period was 09/20/2016. Contraception: tubal ligation Last Pap: 2012. Results were: normal Last mammogram: 2012. Results were: Normal but dense  Obstetric History OB History  Gravida Para Term Preterm AB Living  2 2       2   SAB TAB Ectopic Multiple Live Births               # Outcome Date GA Lbr Len/2nd Weight Sex Delivery Anes PTL Lv  2 Para           1 Para                ROS: A ROS was performed and pertinent positives and negatives are included in the history.  GENERAL: No fevers or chills. HEENT: No change in vision, no earache, sore throat or sinus congestion. NECK: No pain or stiffness. CARDIOVASCULAR: No chest pain or pressure. No palpitations. PULMONARY: No shortness of breath, cough or wheeze. GASTROINTESTINAL: No abdominal pain, nausea, vomiting or diarrhea, melena or bright red blood per rectum. GENITOURINARY: No urinary frequency, urgency, hesitancy or dysuria. MUSCULOSKELETAL: No joint or muscle pain, no back pain, no recent trauma. DERMATOLOGIC: No rash, no itching, no lesions. ENDOCRINE: No polyuria, polydipsia, no heat or cold intolerance. No recent change in weight. HEMATOLOGICAL: No anemia or easy bruising or bleeding. NEUROLOGIC: No headache, seizures, numbness, tingling or weakness.  PSYCHIATRIC: No depression, no loss of interest in normal activity or change in sleep pattern.     Exam: chaperone present  BP 134/86   Ht 5\' 2"  (1.575 m)   Wt 167 lb (75.8 kg)   LMP 09/20/2016   BMI 30.54 kg/m   Body mass index is 30.54 kg/m.  General appearance : Well developed well nourished female. No acute distress HEENT: Eyes: no retinal hemorrhage or exudates,  Neck supple, trachea midline, no carotid bruits, no thyroidmegaly Lungs: Clear to auscultation, no rhonchi or wheezes, or rib retractions  Heart: Regular rate and rhythm, no murmurs or gallops Breast:Examined in sitting and supine position were symmetrical in appearance, no palpable masses or tenderness,  no skin retraction, no nipple inversion, no nipple discharge, no skin discoloration, no axillary or supraclavicular lymphadenopathy Abdomen: no palpable masses or tenderness, no rebound or guarding Extremities: no edema or skin discoloration or tenderness  Pelvic:  Bartholin, Urethra, Skene Glands: Within normal limits             Vagina: No gross lesions or discharge  Cervix: No gross lesions or discharge  Uterus  anteverted, normal size, shape and consistency, non-tender and mobile  Adnexa  Without masses or tenderness  Anus and perineum  normal   Rectovaginal  normal sphincter tone without palpated masses or tenderness             Hemoccult not indicated   On 30 leg raises patient  has some discomfort in the center of her back  Assessment/Plan:  41 y.o. female for annual exam with possible underlying sciatica versus herniated disc I'm going to refer her to the orthopedic surgeon for further evaluation. She is going to return to the office tomorrow for the following screening fasting blood work: Fasting lipid profile, comprehensive metabolic panel, TSH, CBC, and urinalysis. She was provided with a requisition to schedule her mammogram. Her urinalysis today demonstrated few bacteria culture pending. We discussed  importance of monthly breast exams.   Ok EdwardsFERNANDEZ,Rebeka Kimble H MD, 4:48 PM 09/27/2016

## 2016-09-28 ENCOUNTER — Other Ambulatory Visit: Payer: Self-pay

## 2016-09-28 LAB — CBC WITH DIFFERENTIAL/PLATELET
BASOS PCT: 1 %
Basophils Absolute: 45 cells/uL (ref 0–200)
EOS PCT: 5 %
Eosinophils Absolute: 225 cells/uL (ref 15–500)
HCT: 39.9 % (ref 35.0–45.0)
Hemoglobin: 13.3 g/dL (ref 11.7–15.5)
Lymphocytes Relative: 30 %
Lymphs Abs: 1350 cells/uL (ref 850–3900)
MCH: 30.8 pg (ref 27.0–33.0)
MCHC: 33.3 g/dL (ref 32.0–36.0)
MCV: 92.4 fL (ref 80.0–100.0)
MONOS PCT: 10 %
MPV: 10 fL (ref 7.5–12.5)
Monocytes Absolute: 450 cells/uL (ref 200–950)
NEUTROS ABS: 2430 {cells}/uL (ref 1500–7800)
Neutrophils Relative %: 54 %
PLATELETS: 237 10*3/uL (ref 140–400)
RBC: 4.32 MIL/uL (ref 3.80–5.10)
RDW: 12.7 % (ref 11.0–15.0)
WBC: 4.5 10*3/uL (ref 3.8–10.8)

## 2016-09-28 LAB — LIPID PANEL
CHOL/HDL RATIO: 3.6 ratio (ref <5.0)
CHOLESTEROL: 185 mg/dL (ref <200)
HDL: 51 mg/dL (ref >50)
LDL Cholesterol: 107 mg/dL — ABNORMAL HIGH (ref <100)
Triglycerides: 136 mg/dL (ref <150)
VLDL: 27 mg/dL (ref <30)

## 2016-09-28 LAB — COMPREHENSIVE METABOLIC PANEL
ALBUMIN: 4 g/dL (ref 3.6–5.1)
ALK PHOS: 66 U/L (ref 33–115)
ALT: 18 U/L (ref 6–29)
AST: 17 U/L (ref 10–30)
BUN: 14 mg/dL (ref 7–25)
CHLORIDE: 106 mmol/L (ref 98–110)
CO2: 27 mmol/L (ref 20–31)
CREATININE: 0.56 mg/dL (ref 0.50–1.10)
Calcium: 8.8 mg/dL (ref 8.6–10.2)
Glucose, Bld: 97 mg/dL (ref 65–99)
POTASSIUM: 4.4 mmol/L (ref 3.5–5.3)
Sodium: 139 mmol/L (ref 135–146)
TOTAL PROTEIN: 6.6 g/dL (ref 6.1–8.1)
Total Bilirubin: 0.4 mg/dL (ref 0.2–1.2)

## 2016-09-28 LAB — PAP, TP IMAGING W/ HPV RNA, RFLX HPV TYPE 16,18/45: HPV MRNA, HIGH RISK: NOT DETECTED

## 2016-09-28 LAB — URINE CULTURE: Organism ID, Bacteria: NO GROWTH

## 2016-09-28 LAB — TSH: TSH: 2.2 mIU/L

## 2016-10-02 ENCOUNTER — Other Ambulatory Visit: Payer: Self-pay | Admitting: Gynecology

## 2016-10-02 DIAGNOSIS — Z1231 Encounter for screening mammogram for malignant neoplasm of breast: Secondary | ICD-10-CM

## 2016-10-12 ENCOUNTER — Ambulatory Visit
Admission: RE | Admit: 2016-10-12 | Discharge: 2016-10-12 | Disposition: A | Discharge status: Home / Self Care | Payer: No Typology Code available for payment source | Source: Ambulatory Visit | Attending: Gynecology | Admitting: Gynecology

## 2016-10-12 DIAGNOSIS — Z1231 Encounter for screening mammogram for malignant neoplasm of breast: Secondary | ICD-10-CM

## 2016-10-15 ENCOUNTER — Other Ambulatory Visit: Payer: Self-pay | Admitting: Gynecology

## 2016-10-15 DIAGNOSIS — R928 Other abnormal and inconclusive findings on diagnostic imaging of breast: Secondary | ICD-10-CM

## 2016-10-17 ENCOUNTER — Other Ambulatory Visit (HOSPITAL_COMMUNITY): Payer: Self-pay | Admitting: *Deleted

## 2016-10-17 DIAGNOSIS — R928 Other abnormal and inconclusive findings on diagnostic imaging of breast: Secondary | ICD-10-CM

## 2016-11-08 ENCOUNTER — Ambulatory Visit
Admission: RE | Admit: 2016-11-08 | Discharge: 2016-11-08 | Disposition: A | Discharge status: Home / Self Care | Payer: No Typology Code available for payment source | Source: Ambulatory Visit | Attending: Obstetrics and Gynecology | Admitting: Obstetrics and Gynecology

## 2016-11-08 ENCOUNTER — Ambulatory Visit (HOSPITAL_COMMUNITY)
Admission: RE | Admit: 2016-11-08 | Discharge: 2016-11-08 | Disposition: A | Discharge status: Home / Self Care | Payer: Self-pay | Source: Ambulatory Visit | Attending: Obstetrics and Gynecology | Admitting: Obstetrics and Gynecology

## 2016-11-08 ENCOUNTER — Encounter (HOSPITAL_COMMUNITY): Payer: Self-pay

## 2016-11-08 VITALS — BP 114/80 | Temp 98.2°F | Ht 62.5 in | Wt 166.6 lb

## 2016-11-08 DIAGNOSIS — R928 Other abnormal and inconclusive findings on diagnostic imaging of breast: Secondary | ICD-10-CM

## 2016-11-08 DIAGNOSIS — Z1239 Encounter for other screening for malignant neoplasm of breast: Secondary | ICD-10-CM

## 2016-11-08 NOTE — Progress Notes (Signed)
[facility] due to recommending additional imaging of the left breast. Screening mammogram completed 10/12/2016.  Pap Smear: Pap smear not completed today. Last Pap smear was 09/27/2016 at Dr. Fontaine NoFernandez's office and normal with negative HPV. Last Pap smear result is in EPIC.  Physical exam: Breasts Breasts symmetrical. No skin abnormalities bilateral breasts. No nipple retraction bilateral breasts. No nipple discharge bilateral breasts. No lymphadenopathy. No lumps palpated bilateral breasts. No complaints of pain or tenderness on exam. [facility] for a left breast diagnostic mammogram and possible breast ultrasound per recommendation. Appointment scheduled for Thursday, November 08, 2016 at 0850.        Pelvic/Bimanual No Pap smear completed today since last Pap smear and HPV typing was 09/27/2016. Pap smear not indicated per BCCCP guidelines.   Smoking History: Patient has never smoked.  Patient Navigation: Patient education provided. Access to services provided for [facility]. Spanish interpreter provided.  Used Spanish interpreter [employer]anca Lindner from [employer].

## 2016-11-08 NOTE — Patient Instructions (Signed)
Explained breast self awareness with Marisa SprinklesMartha Venus. Patient did not need a Pap smear today due to last Pap smear and HPV typing was 09/27/2016. Let her know BCCCP will cover Pap smears and HPV typing every 5 years unless has a history of abnormal Pap smears. [facility] for a left breast diagnostic mammogram and possible breast ultrasound per recommendation. Appointment scheduled for Thursday, November 08, 2016 at 0850. Marisa SprinklesMartha Storti verbalized understanding.  Golden Gilreath, Kathaleen Maserhristine Poll, RN 10:33 AM

## 2016-11-09 ENCOUNTER — Encounter (HOSPITAL_COMMUNITY): Payer: Self-pay | Admitting: *Deleted

## 2017-02-06 ENCOUNTER — Encounter: Payer: Self-pay | Admitting: Gynecology

## 2020-12-19 ENCOUNTER — Other Ambulatory Visit: Payer: Self-pay | Admitting: Obstetrics and Gynecology

## 2020-12-19 DIAGNOSIS — Z1231 Encounter for screening mammogram for malignant neoplasm of breast: Secondary | ICD-10-CM

## 2021-01-10 ENCOUNTER — Ambulatory Visit: Payer: No Typology Code available for payment source

## 2021-05-30 ENCOUNTER — Other Ambulatory Visit: Payer: Self-pay | Admitting: Obstetrics and Gynecology

## 2021-05-30 DIAGNOSIS — Z1231 Encounter for screening mammogram for malignant neoplasm of breast: Secondary | ICD-10-CM

## 2021-06-27 ENCOUNTER — Other Ambulatory Visit: Payer: Self-pay

## 2021-06-27 ENCOUNTER — Ambulatory Visit: Payer: Self-pay | Admitting: *Deleted

## 2021-06-27 ENCOUNTER — Ambulatory Visit
Admission: RE | Admit: 2021-06-27 | Discharge: 2021-06-27 | Disposition: A | Discharge status: Home / Self Care | Payer: No Typology Code available for payment source | Source: Ambulatory Visit | Attending: Obstetrics and Gynecology | Admitting: Obstetrics and Gynecology

## 2021-06-27 VITALS — BP 124/76 | Wt 168.4 lb

## 2021-06-27 DIAGNOSIS — Z1231 Encounter for screening mammogram for malignant neoplasm of breast: Secondary | ICD-10-CM

## 2021-06-27 DIAGNOSIS — Z1239 Encounter for other screening for malignant neoplasm of breast: Secondary | ICD-10-CM

## 2021-06-27 DIAGNOSIS — Z1211 Encounter for screening for malignant neoplasm of colon: Secondary | ICD-10-CM

## 2021-06-27 NOTE — Addendum Note (Signed)
Addended by: Narda Rutherford on: 06/27/2021 11:25 AM   Modules accepted: Orders

## 2021-06-27 NOTE — Progress Notes (Signed)
[name] is a 45 y.o. female who presents to [facility] clinic today with Carter complaints.    Pap Smear: Pap smear not completed today. Last Pap smear was 09/27/2016 at Dr. Fontaine Carter office and normal with negative HPV. Last Pap smear result is in EPIC.   Physical exam: Breasts Breasts symmetrical. Carter skin abnormalities bilateral breasts. Carter nipple retraction bilateral breasts. Carter nipple discharge bilateral breasts. Carter lymphadenopathy. Carter lumps palpated bilateral breasts. Carter complaints of pain or tenderness on exam.      MS DIGITAL SCREENING BILATERAL  Result Date: 10/12/2016 CLINICAL DATA:  Screening. EXAM: DIGITAL SCREENING BILATERAL MAMMOGRAM WITH CAD COMPARISON:  Previous exam(s). ACR Breast Density Category c: The breast tissue is heterogeneously dense, which may obscure small masses. FINDINGS: In the left breast, a possible mass warrants further evaluation. In the right breast, Carter findings suspicious for malignancy. Images were processed with CAD. IMPRESSION: Further evaluation is suggested for possible mass in the left breast. RECOMMENDATION: Diagnostic mammogram and possibly ultrasound of the left breast. (Code:FI-L-45M) The patient will be contacted regarding the findings, and additional imaging will be scheduled. BI-RADS CATEGORY  0: Incomplete. Need additional imaging evaluation and/or prior mammograms for comparison. Electronically Signed   By: Caroline Carter M.D.   On: 10/12/2016 13:58   MM DIAG BREAST TOMO UNI LEFT  Result Date: 11/08/2016 CLINICAL DATA:  Screening recall for left breast mass. EXAM: 2D DIGITAL DIAGNOSTIC UNILATERAL LEFT MAMMOGRAM WITH CAD AND ADJUNCT TOMO LEFT BREAST ULTRASOUND COMPARISON:  Previous exam(s). ACR Breast Density Category c: The breast tissue is heterogeneously dense, which may obscure small masses. FINDINGS: Cc and MLO tomograms were performed of the left breast. The initially questioned possible left breast mass is not seen on the additional imaging  with only dense fibroglandular tissue seen in the far upper outer posterior left breast. Mammographic images were processed with CAD. Targeted ultrasound of the entire upper left breast was performed. Carter suspicious masses or abnormalities are identified. A benign-appearing lymph node in the axillary tail at 1 o'clock 10 cm from nipple measures 0.9 x 0.6 x 1 cm. This likely corresponds with the mass seen on the initial screening mammography. 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 RECOMMENDATION: Screening mammogram in one year.(Code:SM-B-01Y) I have discussed the findings and recommendations with the patient. Results were also provided in writing at the conclusion of the visit. If applicable, a reminder letter will be sent to the patient regarding the next appointment. BI-RADS CATEGORY  2: Benign. Electronically Signed   By: Caroline Carter M.D.   On: 11/08/2016 09:53     Pelvic/Bimanual Pap is not indicated today per BCCCP guidelines.   Smoking History: Patient has never smoked.   Patient Navigation: Patient education provided. Access to services provided for patient through [facility] program. Spanish interpreter Caroline Carter from [facility] provided.   Colorectal Cancer Screening: Per patient has never had colonoscopy completed. FIT Test given to patient to complete. Carter complaints today.    Breast and Cervical Cancer Risk Assessment: Patient does not have family history of breast cancer, known genetic mutations, or radiation treatment to the chest before age 68. Patient does not have history of cervical dysplasia, immunocompromised, or DES exposure in-utero.  Risk Assessment     Risk Scores       06/27/2021   Last edited by: Caroline Rutherford, LPN   5-year risk: 0.5 %   Lifetime risk: 6 %  A: BCCCP exam without pap smear Carter complaints.  P: Referred patient to the [facility] of [facility] for a screening mammogram. Appointment scheduled Tuesday, June 27, 2021 at 1130.  Caroline Heidelberg, RN 06/27/2021 10:50 AM

## 2021-06-27 NOTE — Addendum Note (Signed)
Addended by: Narda Rutherford on: 06/27/2021 11:44 AM   Modules accepted: Orders

## 2021-06-27 NOTE — Patient Instructions (Signed)
Explained breast self awareness with [name]. Patient did not need a Pap smear today due to last Pap smear and HPV typing was 09/27/2016. Let her know BCCCP will cover Pap smears and HPV typing every 5 years unless has a history of abnormal Pap smears and that her next Pap smear is due in January 2023. Referred patient to the [facility] for a screening mammogram. Appointment scheduled Tuesday, June 27, 2021 at 1130. Patient escorted to the mobile unit following BCCCP appointment for her screening mammogram. Let patient know the Breast Center will follow up with her within the next couple weeks with results of mammogram by letter or phone. Kambre Messner verbalized understanding.  Nicodemus Denk, Kathaleen Maser, RN 10:50 AM

## 2021-07-15 LAB — FECAL OCCULT BLOOD, IMMUNOCHEMICAL: Fecal Occult Bld: NEGATIVE

## 2021-07-20 ENCOUNTER — Telehealth: Payer: Self-pay

## 2021-07-20 NOTE — Telephone Encounter (Signed)
Via, [name], Spanish Interpreter Haroldine Laws), Patient informed negative FIT Test results. Patient verbalized understanding.

## 2022-03-31 ENCOUNTER — Encounter: Payer: Self-pay | Admitting: *Deleted

## 2022-03-31 ENCOUNTER — Other Ambulatory Visit: Payer: Self-pay | Admitting: Cardiology

## 2022-03-31 LAB — GLUCOSE, POCT (MANUAL RESULT ENTRY): POC Glucose: 99 mg/dl (ref 70–99)

## 2022-04-03 LAB — LIPID PANEL W/O CHOL/HDL RATIO
Cholesterol, Total: 227 mg/dL — ABNORMAL HIGH (ref 100–199)
HDL: 59 mg/dL (ref >39)
LDL Chol Calc (NIH): 137 mg/dL — ABNORMAL HIGH (ref 0–99)
Triglycerides: 172 mg/dL — ABNORMAL HIGH (ref 0–149)
VLDL Cholesterol Cal: 31 mg/dL (ref 5–40)

## 2022-04-10 ENCOUNTER — Other Ambulatory Visit: Payer: Self-pay

## 2022-04-11 ENCOUNTER — Telehealth: Payer: Self-pay

## 2022-04-11 LAB — FECAL OCCULT BLOOD, IMMUNOCHEMICAL: Fecal Occult Bld: NEGATIVE

## 2022-04-11 LAB — SPECIMEN STATUS REPORT

## 2022-04-11 NOTE — Telephone Encounter (Signed)
Via Julie Sowell, Spanish Interpreter ([place]), Patient informed negative FIT test results. Patient verbalized understanding.  

## 2022-05-01 ENCOUNTER — Encounter: Payer: Self-pay | Admitting: Cardiology

## 2022-05-07 ENCOUNTER — Other Ambulatory Visit: Payer: Self-pay

## 2022-05-07 ENCOUNTER — Inpatient Hospital Stay: Payer: Self-pay | Attending: Obstetrics and Gynecology | Admitting: *Deleted

## 2022-05-07 VITALS — BP 102/70 | Ht 62.0 in | Wt 163.1 lb

## 2022-05-07 DIAGNOSIS — Z0000 Encounter for general adult medical examination without abnormal findings: Secondary | ICD-10-CM

## 2022-05-07 DIAGNOSIS — Z1231 Encounter for screening mammogram for malignant neoplasm of breast: Secondary | ICD-10-CM

## 2022-05-07 NOTE — Progress Notes (Signed)
[facility] initial screening   Interpreter- [name], [employer]   Clinical Measurement: There were no vitals filed for this visit. Fasting Labs Drawn Today, will review with patient when they result.   Medical History:  Patient states that she has high cholesterol, does not have high blood pressure and she does not have diabetes.  Medications:  Patient states that she does not take medication to lower cholesterol, blood pressure or blood sugar.  Patient does not take an aspirin a day to help prevent a heart attack or stroke.   Blood pressure, self measurement: Patient states that she does not measure blood pressure from home. She checks her blood pressure N/A. She shares her readings with a health care provider: N/A.   Nutrition: Patient states that on average she eats 0 cups of fruit and 0 cups of vegetables per day. Patient states that she does not eat fish at least 2 times per week. Patient eats less than half servings of whole grains. Patient drinks less than 36 ounces of beverages with added sugar weekly: yes. Patient is currently watching sodium or salt intake: yes. In the past 7 days patient has consumed drinks containing alcohol on 0 days. On a day that patient consumes drinks containing alcohol on average 0 drinks are consumed.      Physical activity:  Patient states that she gets 30 minutes of moderate and 0 minutes of vigorous physical activity each week.  Smoking status:  Patient states that she has has never smoked .   Quality of life:  Over the past 2 weeks patient states that she had little interest or pleasure in doing things: several days. She has been feeling down, depressed or hopeless:not at all.    Risk reduction and counseling:   Health Coaching: Spoke with patient about the daily recommendation for fruits and vegetables (2 cups of fruit and 3 cups of vegetables). Showed patient what a serving size looks like. Explained the importance of heart healthy fish in weekly  diet. Gave suggestions for salmon, tuna, mackerel, sardines, sea bass or trout. Patient consumes oatmeal but no other whole grains. Gave suggestions for other whole grains such as: whole wheat bread, brown rice, whole wheat pasta and whole grain cereals. Patient walks one day a week but is unable to do a lot of exercise due to chronic back pain. Encouraged patient to try and do stretching or yoga to help with back pain. Encouraged patient to FU with a PCP for this ongoing pain that she has been experiencing.   Goal: Patient states that she would like to learn how to eat healthier over the next 3 months. Patient would like to loose weight as part of her healthier eating habits.   Navigation:  I will notify patient of lab results.  Patient is aware of 2 more health coaching sessions and a follow up.  Time: 25 minutes

## 2022-05-08 LAB — LIPID PANEL
Chol/HDL Ratio: 3.4 ratio (ref 0.0–4.4)
Cholesterol, Total: 189 mg/dL (ref 100–199)
HDL: 56 mg/dL (ref >39)
LDL Chol Calc (NIH): 112 mg/dL — ABNORMAL HIGH (ref 0–99)
Triglycerides: 117 mg/dL (ref 0–149)
VLDL Cholesterol Cal: 21 mg/dL (ref 5–40)

## 2022-05-08 LAB — GLUCOSE, RANDOM: Glucose: 89 mg/dL (ref 70–99)

## 2022-05-08 LAB — HEMOGLOBIN A1C
Est. average glucose Bld gHb Est-mCnc: 111 mg/dL
Hgb A1c MFr Bld: 5.5 % (ref 4.8–5.6)

## 2022-05-14 ENCOUNTER — Telehealth: Payer: Self-pay

## 2022-05-14 NOTE — Telephone Encounter (Signed)
Health coaching 2   interpreter- [name], [employer]   Labs- 189 cholesterol, 112 LDL cholesterol, 117 triglycerides, 56 HDL cholesterol, 5.5 hemoglobin A1C, 89 mean plasma glucose. Patient understands and is aware of her lab results.   Goals-  1. Reduce the amount of fried and fatty foods consumed. 2. Decrease the amount of red meats consumed. Increase lean proteins like chicken and fish.  3. Increase whole grain, fresh fruit and fresh vegetable intake. 4. Try to do daily exercise when able to for 20-30 minutes or what is physically comfortable.    Navigation:  Patient is aware of 1 more health coaching sessions and a follow up. Patient is scheduled for FU with Internal Medicine on Tuesday, May 22, 2022 @ 2:15 pm.  Time- 10 minutes

## 2022-05-22 ENCOUNTER — Ambulatory Visit (INDEPENDENT_AMBULATORY_CARE_PROVIDER_SITE_OTHER): Payer: Self-pay

## 2022-05-22 VITALS — BP 111/57 | HR 70 | Temp 97.7°F | Wt 162.1 lb

## 2022-05-22 DIAGNOSIS — Z0000 Encounter for general adult medical examination without abnormal findings: Secondary | ICD-10-CM | POA: Insufficient documentation

## 2022-05-22 DIAGNOSIS — E782 Mixed hyperlipidemia: Secondary | ICD-10-CM

## 2022-05-22 DIAGNOSIS — R0609 Other forms of dyspnea: Secondary | ICD-10-CM

## 2022-05-22 DIAGNOSIS — R5383 Other fatigue: Secondary | ICD-10-CM

## 2022-05-22 DIAGNOSIS — M5440 Lumbago with sciatica, unspecified side: Secondary | ICD-10-CM

## 2022-05-22 DIAGNOSIS — R5382 Chronic fatigue, unspecified: Secondary | ICD-10-CM

## 2022-05-22 DIAGNOSIS — M67919 Unspecified disorder of synovium and tendon, unspecified shoulder: Secondary | ICD-10-CM | POA: Insufficient documentation

## 2022-05-22 DIAGNOSIS — E785 Hyperlipidemia, unspecified: Secondary | ICD-10-CM

## 2022-05-22 DIAGNOSIS — M67911 Unspecified disorder of synovium and tendon, right shoulder: Secondary | ICD-10-CM

## 2022-05-22 NOTE — Assessment & Plan Note (Signed)
Pain and limited movement and shoulders bilaterally.  She had an MRI of her right shoulder 2 years ago that diagnosed rotator cuff tendinopathy.  She was doing PT since that point and says this helped quite a bit with range of motion.  She currently still has some limited functionality and pain on active movement of her right shoulder.  She also says that recently she has been developing similar symptoms in her left shoulder.  Limited AROM on abduction, internal/external rotation of bilateral shoulders.  Full PROM on both shoulders, but pain on internal/external rotation and abduction.  Full/empty can positive positive.  Consistent with bilateral rotator cuff tendinopathy.  She currently has no insurance and was referred from [facility]  Working on orange card now. - Counseled to continue shoulder exercises and use ice/heat packs as needed.  Can also use Tylenol up to every 6 hours and counseled on max dose. - Follow-up at next visit and consider further treatment as needed if she has gotten orange card or insurance at that point

## 2022-05-22 NOTE — Progress Notes (Signed)
CC: Establish Care, [facility]  HPI:  Caroline Carter is a 46 y.o.-year-old female with past medical history as below presenting for establishment of care through [facility] referral.  Please see encounters tab for problem-based charting.  Past Medical History:  Diagnosis Date   Allergy    Anxiety    Asthma    Depression    Review of Systems: As in HPI.  Please see encounters tab for problem is charting.   Physical Exam:  Vitals:   05/22/22 1427  BP: (!) 111/57  Pulse: 70  Temp: 97.7 F (36.5 C)  TempSrc: Oral  SpO2: 98%  Weight: 162 lb 1.6 oz (73.5 kg)   General:Well-appearing, pleasant, In NAD HEENT: No mucosal pallor Cardiac: RRR, no murmurs rubs or gallops. Respiratory: Normal work of breathing on room air, CTAB Abdominal: Soft, nontender, nondistended MSK/neuro: Limited AROM on abduction, internal/external rotation of bilateral shoulders.  Full PROM on both shoulders, but pain on internal/external rotation and abduction.  Full/empty can positive positive.  No tenderness to palpation over spine.  Diffuse mild tenderness to palpation over lower back, but not hips or thighs.  Straight leg raise negative.  Sensation intact in lower extremities bilaterally.  5/5 strength of BLE.  Assessment & Plan:   Tendinopathy of rotator cuff Pain and limited movement and shoulders bilaterally.  She had an MRI of her right shoulder 2 years ago that diagnosed rotator cuff tendinopathy.  She was doing PT since that point and says this helped quite a bit with range of motion.  She currently still has some limited functionality and pain on active movement of her right shoulder.  She also says that recently she has been developing similar symptoms in her left shoulder.  Limited AROM on abduction, internal/external rotation of bilateral shoulders.  Full PROM on both shoulders, but pain on internal/external rotation and abduction.  Full/empty can positive positive.  Consistent with bilateral  rotator cuff tendinopathy.  She currently has no insurance and was referred from [facility].  Working on orange card now. - Counseled to continue shoulder exercises and use ice/heat packs as needed.  Can also use Tylenol up to every 6 hours and counseled on max dose. - Follow-up at next visit and consider further treatment as needed if she has gotten orange card or insurance at that point  Fatigue Patient with chronic fatigue which has worsened in the past month or so.  She mentions that her hips and legs feel sore when she stands for long period of time.  She occasionally feels like her entire body is weak and she is unable to perform her usual activities.  She occasionally gets short of breath walking up stairs or long distances.  She denies any chest pain, dizziness, significant weight fluctuations, hot flashes, problems with sleep, blood in stool, nausea, or vomiting.  She does endorse that her periods have become longer and heavier in the past 3 months, she was previously having periods that last 4 days another 7 days and are heavier in the first couple days.  PHQ-9 was 4.  Other than rotator cuff tendinopathy which is discussed elsewhere she has no weakness on exam.  She has no mucosal pallor. - We will get CBC, BMP today and follow-up.  Of note she is referred by [facility] and has no orange card or insurance currently.  If she is confirmed anemic and has orange card, we can obtain iron studies at that point.  Also consider TSH at that point, but deferred  today due to cost.  Midline low back pain with sciatica Endorses stable lower back pain that occasionally radiates down to the bottom of her legs all the way down to her feet.  She describes that the pain in her back is sore and occasionally feels inflamed.  She denies any fevers, night sweats, bowel or bladder incontinence, or saddle paresthesias.  She does not have any weakness or numbness of lower extremities on exam.  She does not have tenderness  to palpation over the spine on exam.  She does have diffuse mild tenderness across her lower back, but not of her hips or thighs.  Straight leg raise was negative. - Recommended continued conservative treatment with ice/heat packs and Tylenol as needed for now.  When she gets her orange card can consider further management with lidocaine patch or Voltaren.  Can also add Flexeril if she is having cramps or spasms. - Recommended and provided back exercises. - We will follow-up and reevaluate at that time, can add more to treatment if she has her chart.  Dyspnea on exertion Reports dyspnea on exertion for the past month when she walks up flights of stairs or long distances.  She usually rests for about 1-2 minutes and her symptoms improved.  She does not report any chest pain, cough, lower extremity or abdominal swelling, weight gain, or sleep issues.  She has never smoked.  Cardiac and respiratory exam are unremarkable without murmurs rubs or gallops and lungs are clear to auscultation. - Obtaining CBC/BMP today, with primary concern for possible anemia as mentioned under Fatigue - If CBC is unremarkable and she continues to have dyspnea on exertion, can consider chest x-ray and further work-up when she has orange card  Health care maintenance Has mammogram, Pap smear scheduled through [facility].  Recent FIT testing on 7/19 was negative.  Believes she has had previous HIV screening about 20 years ago.  Unsure if she has had hep C screening. - Deferred tetanus/hep C today due to lack of orange card.  Can consider at next appointment if she has this.  Hyperlipidemia Had elevated total cholesterol, triglycerides, LDL at initial screening in 07/23.  These improved in 1 month as of most recent lipid panel on 05/08/2022 with just lifestyle changes.  Her ASCVD is 0.6% and she does not qualify for statin.  We will continue to encourage lifestyle modifications and follow-up lipid panel in 1 year.   Patient  seen with Dr. Heide Spark

## 2022-05-22 NOTE — Assessment & Plan Note (Addendum)
Patient with chronic fatigue which has worsened in the past month or so.  She mentions that her hips and legs feel sore when she stands for long period of time.  She occasionally feels like her entire body is weak and she is unable to perform her usual activities.  She occasionally gets short of breath walking up stairs or long distances.  She denies any chest pain, dizziness, significant weight fluctuations, hot flashes, problems with sleep, blood in stool, nausea, or vomiting.  She does endorse that her periods have become longer and heavier in the past 3 months, she was previously having periods that last 4 days another 7 days and are heavier in the first couple days.  PHQ-9 was 4.  Other than rotator cuff tendinopathy which is discussed elsewhere she has no weakness on exam.  She has no mucosal pallor. - We will get CBC, BMP today and follow-up.  Of note she is referred by [facility] and has no orange card or insurance currently.  If she is confirmed anemic and has orange card, we can obtain iron studies at that point.  Also consider TSH at that point, but deferred today due to cost.

## 2022-05-22 NOTE — Assessment & Plan Note (Signed)
Reports dyspnea on exertion for the past month when she walks up flights of stairs or long distances.  She usually rests for about 1-2 minutes and her symptoms improved.  She does not report any chest pain, cough, lower extremity or abdominal swelling, weight gain, or sleep issues.  She has never smoked.  Cardiac and respiratory exam are unremarkable without murmurs rubs or gallops and lungs are clear to auscultation. - Obtaining CBC/BMP today, with primary concern for possible anemia as mentioned under Fatigue - If CBC is unremarkable and she continues to have dyspnea on exertion, can consider chest x-ray and further work-up when she has orange card

## 2022-05-22 NOTE — Assessment & Plan Note (Signed)
Had elevated total cholesterol, triglycerides, LDL at initial screening in 07/23.  These improved in 1 month as of most recent lipid panel on 05/08/2022 with just lifestyle changes.  Her ASCVD is 0.6% and she does not qualify for statin.  We will continue to encourage lifestyle modifications and follow-up lipid panel in 1 year.

## 2022-05-22 NOTE — Assessment & Plan Note (Signed)
Endorses stable lower back pain that occasionally radiates down to the bottom of her legs all the way down to her feet.  She describes that the pain in her back is sore and occasionally feels inflamed.  She denies any fevers, night sweats, bowel or bladder incontinence, or saddle paresthesias.  She does not have any weakness or numbness of lower extremities on exam.  She does not have tenderness to palpation over the spine on exam.  She does have diffuse mild tenderness across her lower back, but not of her hips or thighs.  Straight leg raise was negative. - Recommended continued conservative treatment with ice/heat packs and Tylenol as needed for now.  When she gets her orange card can consider further management with lidocaine patch or Voltaren.  Can also add Flexeril if she is having cramps or spasms. - Recommended and provided back exercises. - We will follow-up and reevaluate at that time, can add more to treatment if she has her chart.

## 2022-05-22 NOTE — Assessment & Plan Note (Signed)
Has mammogram, Pap smear scheduled through [facility].  Recent FIT testing on 7/19 was negative.  Believes she has had previous HIV screening about 20 years ago.  Unsure if she has had hep C screening. - Deferred tetanus/hep C today due to lack of orange card.  Can consider at next appointment if she has this.

## 2022-05-22 NOTE — Patient Instructions (Signed)
Ms.Amirra Monter, it was a pleasure seeing you today! You endorsed feeling well today. Below are some of the things we talked about this visit. We look forward to seeing you in the follow up appointment!  Today we discussed: Shoulder and back pain: I will attach some exercises that you can do to improve mobility and strength in your shoulder.  Additionally, you can use ice/heating pads as well to manage the pain.  Fatigue: I will follow-up with you about the results of your lab tests.  I have ordered the following labs today:   Lab Orders         BMP8+Anion Gap         CBC with Diff       Referrals ordered today:   Referral Orders  No referral(s) requested today     I have ordered the following medication/changed the following medications:   Stop the following medications: There are no discontinued medications.   Start the following medications: No orders of the defined types were placed in this encounter.    Follow-up: 3 months after establishing your paperwork  Please make sure to arrive 15 minutes prior to your next appointment. If you arrive late, you may be asked to reschedule.   We look forward to seeing you next time. Please call our clinic at [phone] if you have any questions or concerns. The best time to call is Monday-Friday from 9am-4pm, but there is someone available 24/7. If after hours or the weekend, call the main hospital number and ask for the Internal Medicine Resident On-Call. If you need medication refills, please notify your pharmacy one week in advance and they will send [place] a request.  Thank you for letting us take part in your care. Wishing you the best!  Thank you, Lyndle Herrlich MD

## 2022-05-23 LAB — CBC WITH DIFFERENTIAL/PLATELET
Basophils Absolute: 0 10*3/uL (ref 0.0–0.2)
Basos: 0 %
EOS (ABSOLUTE): 0.2 10*3/uL (ref 0.0–0.4)
Eos: 5 %
Hematocrit: 37.9 % (ref 34.0–46.6)
Hemoglobin: 12.5 g/dL (ref 11.1–15.9)
Immature Grans (Abs): 0 10*3/uL (ref 0.0–0.1)
Immature Granulocytes: 0 %
Lymphocytes Absolute: 1.6 10*3/uL (ref 0.7–3.1)
Lymphs: 31 %
MCH: 29.3 pg (ref 26.6–33.0)
MCHC: 33 g/dL (ref 31.5–35.7)
MCV: 89 fL (ref 79–97)
Monocytes Absolute: 0.6 10*3/uL (ref 0.1–0.9)
Monocytes: 12 %
Neutrophils Absolute: 2.7 10*3/uL (ref 1.4–7.0)
Neutrophils: 52 %
Platelets: 235 10*3/uL (ref 150–450)
RBC: 4.27 x10E6/uL (ref 3.77–5.28)
RDW: 12.9 % (ref 11.7–15.4)
WBC: 5.2 10*3/uL (ref 3.4–10.8)

## 2022-05-23 LAB — BMP8+ANION GAP
Anion Gap: 15 mmol/L (ref 10.0–18.0)
BUN/Creatinine Ratio: 15 (ref 9–23)
BUN: 11 mg/dL (ref 6–24)
CO2: 22 mmol/L (ref 20–29)
Calcium: 9.2 mg/dL (ref 8.7–10.2)
Chloride: 104 mmol/L (ref 96–106)
Creatinine, Ser: 0.74 mg/dL (ref 0.57–1.00)
Glucose: 84 mg/dL (ref 70–99)
Potassium: 4.3 mmol/L (ref 3.5–5.2)
Sodium: 141 mmol/L (ref 134–144)
eGFR: 100 mL/min/{1.73_m2} (ref >59)

## 2022-05-23 NOTE — Progress Notes (Signed)
Internal Medicine Clinic Attending  I saw and evaluated the patient.  I personally confirmed the key portions of the history and exam documented by Dr. Sridharan and I reviewed pertinent patient test results.  The assessment, diagnosis, and plan were formulated together and I agree with the documentation in the resident's note.  

## 2022-06-05 ENCOUNTER — Ambulatory Visit: Payer: No Typology Code available for payment source

## 2022-06-21 ENCOUNTER — Telehealth: Payer: Self-pay

## 2022-06-21 NOTE — Telephone Encounter (Signed)
Health Coaching 3  interpreter- Rudene Anda, [facility]   Goals- Patient has cut back on the amount of red meat and shrimp. Patient is eating more lean proteins (chicken and tuna). Patient has also been consuming oatmeal daily.    New goal- Patient has not been exercising since initial screening. Patient would like to try and start walking 3 days a week for 15 minutes over the next month.  Barrier to reaching goal-    Strategies to overcome-    Navigation:  Patient is aware of  a follow up session on 08/22/22 @ 3:00 pm.   Time-  10 minutes

## 2022-07-24 ENCOUNTER — Other Ambulatory Visit: Payer: Self-pay

## 2022-07-24 ENCOUNTER — Ambulatory Visit
Admission: RE | Admit: 2022-07-24 | Discharge: 2022-07-24 | Disposition: A | Discharge status: Home / Self Care | Payer: No Typology Code available for payment source | Source: Ambulatory Visit | Attending: Obstetrics and Gynecology | Admitting: Obstetrics and Gynecology

## 2022-07-24 ENCOUNTER — Ambulatory Visit: Payer: Self-pay | Admitting: *Deleted

## 2022-07-24 VITALS — BP 124/78 | Wt 162.3 lb

## 2022-07-24 DIAGNOSIS — Z1231 Encounter for screening mammogram for malignant neoplasm of breast: Secondary | ICD-10-CM

## 2022-07-24 DIAGNOSIS — Z01419 Encounter for gynecological examination (general) (routine) without abnormal findings: Secondary | ICD-10-CM

## 2022-07-24 NOTE — Progress Notes (Signed)
Ms. Mayrin Schmuck is a 46 y.o. G2P2 female who presents to [facility] clinic today with no complaints.    Pap Smear: Pap smear completed today. Last Pap smear was 09/27/2016 at Dr. Sandrea Hughs office and normal with negative HPV. Last Pap smear result is available in EPIC.  Physical exam: Breasts Breasts symmetrical. No skin abnormalities bilateral breasts. No nipple retraction bilateral breasts. No nipple discharge bilateral breasts. No lymphadenopathy. No lumps palpated bilateral breasts. No complaints of pain or tenderness on exam.  MM 3D SCREEN BREAST BILATERAL  Result Date: 07/01/2021 CLINICAL DATA:  Screening. EXAM: DIGITAL SCREENING BILATERAL MAMMOGRAM WITH TOMOSYNTHESIS AND CAD TECHNIQUE: Bilateral screening digital craniocaudal and mediolateral oblique mammograms were obtained. Bilateral screening digital breast tomosynthesis was performed. The images were evaluated with computer-aided detection. COMPARISON:  Previous exam(s). ACR Breast Density Category c: The breast tissue is heterogeneously dense, which may obscure small masses. FINDINGS: There are no findings suspicious for malignancy. IMPRESSION: No mammographic evidence of malignancy. A result letter of this screening mammogram will be mailed directly to the patient. RECOMMENDATION: Screening mammogram in one year. (Code:SM-B-01Y) BI-RADS CATEGORY  1: Negative. Electronically Signed   By: Franki Cabot M.D.   On: 07/01/2021 09:05        Pelvic/Bimanual Ext Genitalia No lesions, no swelling and no discharge observed on external genitalia.        Vagina Vagina pink and normal texture. No lesions or discharge observed in vagina.        Cervix Cervix is present. Cervix pink and of normal texture. No discharge observed.    Uterus Uterus is present and palpable. Uterus in normal position and normal size.        Adnexae Bilateral ovaries present and palpable. No tenderness on palpation.         Rectovaginal No rectal exam completed  today since patient had no rectal complaints. No skin abnormalities observed on exam.     Smoking History: Patient has never smoked.   Patient Navigation: Patient education provided. Access to services provided for patient through [place] program. Spanish interpreter Rudene Anda from [facility] provided.   Colorectal Cancer Screening: Per patient has never had colonoscopy completed. FIT Test completed 04/10/2022 that was negative. No complaints today.     Breast and Cervical Cancer Risk Assessment: Patient does not have family history of breast cancer, known genetic mutations, or radiation treatment to the chest before age 22. Patient does not have history of cervical dysplasia, immunocompromised, or DES exposure in-utero.  Risk Assessment     Risk Scores       07/24/2022 06/27/2021   Last edited by: Royston Bake, CMA McGill, Sherie Mamie Nick, LPN   5-year risk: 0.6 % 0.5 %   Lifetime risk: 5.9 % 6 %            A: [facility] exam with pap smear No complaints.  P: Referred patient to the [facility] for a screening mammogram on the mobile unit. Appointment scheduled Tuesday, July 24, 2022 at 1530.  Loletta Parish, RN 07/24/2022 3:35 PM

## 2022-07-24 NOTE — Patient Instructions (Signed)
Explained breast self awareness with Jiles Harold. Pap smear completed today. Let her know [facility] will cover Pap smears and HPV typing every 5 years unless has a history of abnormal Pap smears. Referred patient to the [facility] for a screening mammogram on the mobile unit. Appointment scheduled Tuesday, July 24, 2022 at 1530. Patient aware of appointment and will be there. Let patient know will follow up with her within the next couple weeks with results of Pap smear by phone. Informed patient that the [facility] will follow up with her within the next couple of weeks with results of her mammogram by letter or phone.Jiles Harold verbalized understanding.  Deklin Bieler, Arvil Chaco, RN 3:35 PM

## 2022-07-27 LAB — CYTOLOGY - PAP
Comment: NEGATIVE
Diagnosis: REACTIVE
High risk HPV: NEGATIVE

## 2022-07-30 ENCOUNTER — Telehealth: Payer: Self-pay

## 2022-07-30 NOTE — Telephone Encounter (Signed)
Called patient via Erika McReynolds, [employer] to give pap smear results. Informed patient that pap smear was normal and HPV was negative. Based on this result her next pap smear will be due in 5 years. Patient voiced understanding.  

## 2022-08-22 ENCOUNTER — Inpatient Hospital Stay: Payer: No Typology Code available for payment source | Attending: Obstetrics and Gynecology | Admitting: *Deleted

## 2022-08-22 VITALS — BP 106/70 | Ht 62.0 in | Wt 161.2 lb

## 2022-08-22 DIAGNOSIS — Z0000 Encounter for general adult medical examination without abnormal findings: Secondary | ICD-10-CM

## 2022-08-22 NOTE — Progress Notes (Signed)
[facility] follow up   Interpreter: [name], [employer]  Clinical Measurement:   Vitals:   08/22/22 1506  BP: 112/70      Medical History:  Patient states that she has high cholesterol, does not have high blood pressure and she does not have diabetes.  Medications:  Patient states that she does not take medication to lower cholesterol, blood pressure and blood sugar.  Patient does not take an aspirin a day to help prevent a heart attack or stroke.    Blood pressure, self measurement: Patient states that she does not measure blood pressure from home. She checks her blood pressure N/A. She shares her readings with a health care provider: N/A.   Nutrition: Patient states that on average she eats 2 cups of fruit and 1 cups of vegetables per day. Patient states that she does not eat fish at least 2 times per week. Patient eats less than half servings of whole grains. Patient drinks less than 36 ounces of beverages with added sugar weekly: yes. Patient is currently watching sodium or salt intake: yes. In the past 7 days patient has had 0 drinks containing alcohol. On average patient drinks 0 drinks containing alcohol per day.      Physical activity:  Patient states that she gets 0 minutes of moderate and 0 minutes of vigorous physical activity each week.  Smoking status:  Patient states that she has has never smoked .   Quality of life:  Over the past 2 weeks patient states that she had little interest or pleasure in doing things: not at all. She has been feeling down, depressed or hopeless:not at all.    Risk reduction and counseling:   Health Coaching: Spoke with patient about trying to maintain a heart healthy diet (watching the amount of fried and fatty foods consumed, eating more lean proteins, daily fruits and vegetables and increased whole grains). Patient has not been exercising recently due to the colder temperatures. Encouraged patient to try and find activities that she can do  indoors during the winter months for exercise.   Navigation: This was the  follow up session for this patient, I will check up on her progress in the coming months.  Time: 20 minutes

## 2022-10-04 ENCOUNTER — Encounter: Payer: Self-pay | Admitting: *Deleted

## 2022-10-05 ENCOUNTER — Encounter: Payer: Self-pay | Admitting: *Deleted

## 2022-10-05 NOTE — Progress Notes (Signed)
Pt has had ongoing health coach and screening, preventive care appt since the 03/31/22 screening event, with [facility] and [facility] program. Seen by [facility] program PCP on 05/22/22 but no future appt noted at this time with this PCP. 03/31/22 event and subsequent lipid test results communicated to pt, along with all the results for her preventive care screening appt (mmg, PAP) by the [facility] and [facility] program teams. No immedicate health equity follow up indicated at this time - PCP confirmation pending next 60 day/6 month reviews. Yvonna Alanis, RN, 10/05/22, 2:49P

## 2022-10-30 ENCOUNTER — Encounter: Payer: Self-pay | Admitting: *Deleted

## 2022-10-30 NOTE — Progress Notes (Signed)
Consult with Etheleen Sia, director of the [facility] and [facility] program for correct f/u needed by pt: "She doesn't need to see [facility] until after 07/25/2023. She will need to be re screened with [facility] in August 2024. If she needs to be seen before August she will need to follow up at Internal Medicine."   Interpreter # [phone] interpreted a phone call to confirm pt has established care for PCP at [facility] and should call them directly at [phone] if she is sick or has any health care needs. The [facility] program and [facility] programs will follow up with her when she is due to see them in October, and August respectively. Pt repeated back [facility] phone number and repeated back instructions for mmg/PAP f/u through [facility] program. Patient denied any current healthcare needs and no appt needed at [facility] at this time.Pt had no further questions and phone call with interpreter was ended.

## 2023-02-27 ENCOUNTER — Encounter: Payer: Self-pay | Admitting: *Deleted

## 2023-02-27 NOTE — Progress Notes (Signed)
During the 1st event follow up, chart review indicated that the pt had already been able to be screened by the [facility] program on 05/07/22 and had a PCP appt with [facility] on 05/22/22. During the 2nd follow event f/u, on 10/30/22, the health equity team consulted with Fonnie Mu, director of the [facility]) program for correct f/u needed by pt: "She doesn't need to see [facility] until after 07/25/2023. She will need to be re screened with [facility] in August 2024. If she needs to be seen before August she will need to follow up at Internal Medicine."  During the 2nd f/u, interpreter # [phone] interpreted a phone call to confirm pt has established care for PCP at [facility] Center and should call them directly at [phone] if she is sick or has any health care needs. The [facility] programs will follow up with her when she is due to see them in October, and August respectively. Pt repeated back [facility] phone number and repeated back instructions for mmg/PAP f/u through [facility] program. Patient denied any current healthcare needs and no appt needed at [facility] at this time.Pt had no further questions and phone call with interpreter was ended. Per chart review during final f/u todoay, chart review indicates pt has had no additional [facility] documented health issues since 2/24 and will be eligible for the [facility] f/u in 8/24 and additional [facility]- arranged PCP visit with the [facility] after that 8/24 [facility] exam. No additional health equity team support scheduled at this time.

## 2023-06-21 ENCOUNTER — Other Ambulatory Visit: Payer: Self-pay

## 2023-06-21 ENCOUNTER — Ambulatory Visit: Payer: No Typology Code available for payment source

## 2023-07-25 ENCOUNTER — Other Ambulatory Visit: Payer: Self-pay

## 2023-07-26 ENCOUNTER — Ambulatory Visit: Payer: No Typology Code available for payment source

## 2023-07-26 ENCOUNTER — Telehealth: Payer: Self-pay

## 2023-07-26 ENCOUNTER — Other Ambulatory Visit: Payer: Self-pay

## 2023-07-26 NOTE — Telephone Encounter (Signed)
Telephoned patient using language line interpreter#[phone]. Informed patient [facility] office [facility]) will contact her and reschedule her appointment on Monday, July 28, 2023.

## 2023-08-12 ENCOUNTER — Inpatient Hospital Stay: Payer: No Typology Code available for payment source | Attending: Obstetrics and Gynecology | Admitting: *Deleted

## 2023-08-12 ENCOUNTER — Other Ambulatory Visit: Payer: Self-pay

## 2023-08-12 VITALS — BP 100/70 | Ht 62.0 in | Wt 161.6 lb

## 2023-08-12 DIAGNOSIS — Z0000 Encounter for general adult medical examination without abnormal findings: Secondary | ICD-10-CM

## 2023-08-12 NOTE — Progress Notes (Signed)
Wisewoman initial screening   Interpreter- [name], [place]   Clinical Measurement:  Vitals:   08/12/23 0840 08/12/23 1310  BP: 102/70 100/70   Fasting Labs Drawn Today, will review with patient when they result.   Medical History: Patient states that she does not know if she has high cholesterol, does not have high blood pressure and she does not have diabetes.  Medications: Patient states that she does not take medication to lower cholesterol, blood pressure or blood sugar.  Patient does not take an aspirin a day to help prevent a heart attack or stroke.    Blood pressure, self measurement: Patient states that she does not measure blood pressure from home. She checks her blood pressure N/A. She shares her readings with a health care provider: N/A.   Nutrition: Patient states that on average she eats 2 cups of fruit and 1 cups of vegetables per day. Patient states that she does not eat fish at least 2 times per week. Patient eats less than half servings of whole grains. Patient drinks less than 36 ounces of beverages with added sugar weekly: yes. Patient is currently watching sodium or salt intake: yes. In the past 7 days patient has consumed drinks containing alcohol on 0 days. On a day that patient consumes drinks containing alcohol on average 0 drinks are consumed.      Physical activity: Patient states that she gets 45 minutes of moderate and 0 minutes of vigorous physical activity each week.  Smoking status: Patient states that she has has never smoked .   Quality of life: Over the past 2 weeks patient states that she had little interest or pleasure in doing things: not at all. She has been feeling down, depressed or hopeless:not at all.   Social Determinants of Health Assessment:   Computer Use: During the last 12 months patient states that she has used any of the following: desktop/laptop, smart phone or tablet/other portable wireless computer: yes.   Internet Use: During  the last 12 months, did you or any member of your household have access to the internet: Yes, by paying a cell phone company or internet service provider.   Food Insecurities: During the last 12 months, where there any times when you were worried that you would run out of food because of a lack of money or other resources: No.   Transportation Barriers: During the last 12 months, have you missed a doctor's appointment because of transportation problems: No.   Childcare Barriers: If you are currently using childcare services, please identify  the type of services you use. (If not using childcare services, please select "Not applicable"): not applicable. During the last 12 months, have you had any barriers to childcare services such as: not applicable.   Housing: What is your housing situation today: I have housing.   Intimate Partner Violence: During the last 12 months, how often did your partner physically hurt you: never. During the last 12 months, how often did your partner insult you or talk down to you: never.  Medication Adherence: During the last 12 months, did you ever forget to take your medicine: not applicable. During the last 12 months, were you careless ar times about taking your medicine: not applicable. During the last 12 months, when you felt better did you sometimes stop taking your medication: not applicable. During the last 12 months, sometimes if you felt worse when you took your medicine did you stop taking it: not applicable.   Risk reduction  and counseling:   Health Coaching: Spoke with patient about the daily recommendations for fruits and vegetables (2 cups of fruit and 3 cups of vegetables). Showed patient what a serving size would look like. Patient consumes one serving of fish weekly. Encouraged patient to try and add in an extra serving per week if possible. Gave suggestions for heart healthy fish that she can try (salmon, tuna, mackerel, sardines, sea bass or trout).  Patient consumes oatmeal daily and brown rice and whole wheat pasta sometimes but not regularly. Encouraged patient to try and add in more whole grains regularly. Patient does not drink beverages sweetened with sugar and does watch her salt intake. Patient has been walking or doing her stationary bike about three days per week for 15 minutes. Patient has set an exercise based goal and will increase her workout days once first goal is reached.  Goal: Increase exercise from three days a week for 15 minutes to three days a week for 30 minutes. Patient will walk or use her stationary bike at home.    Navigation:  I will notify patient of lab results. Patient is aware of 2 more health coaching sessions and a follow up.  Time: 25 minutes

## 2023-08-13 LAB — LIPID PANEL
Chol/HDL Ratio: 3.3 ratio (ref 0.0–4.4)
Cholesterol, Total: 194 mg/dL (ref 100–199)
HDL: 59 mg/dL (ref >39)
LDL Chol Calc (NIH): 121 mg/dL — ABNORMAL HIGH (ref 0–99)
Triglycerides: 77 mg/dL (ref 0–149)
VLDL Cholesterol Cal: 14 mg/dL (ref 5–40)

## 2023-08-13 LAB — GLUCOSE, RANDOM: Glucose: 86 mg/dL (ref 70–99)

## 2023-08-13 LAB — HEMOGLOBIN A1C
Est. average glucose Bld gHb Est-mCnc: 111 mg/dL
Hgb A1c MFr Bld: 5.5 % (ref 4.8–5.6)

## 2023-08-20 ENCOUNTER — Telehealth: Payer: Self-pay

## 2023-08-20 NOTE — Telephone Encounter (Signed)
Attempted to contact patient via [employer] [phone] to give lab results from [facility] but was unable to leave message. Will attempt to reach patient again later.

## 2023-08-26 ENCOUNTER — Telehealth: Payer: Self-pay

## 2023-08-26 NOTE — Telephone Encounter (Signed)
Health coaching 2   interpreter- [employer] [phone]   Labs- 194 cholesterol, 121 LDL cholesterol,  77 triglycerides, 59 HDL cholesterol, 5.5 hemoglobin A1C, 86 mean plasma glucose. Patient understands and is aware of her lab results.   Goals-  1. Reduce the amount of fried and fatty foods consumed. Try to grill, bake, broil or sautee foods in small amount of olive oil instead.  2. Reduce the amount of red meats consumed. Substitute for lean proteins like chicken or fish instead. 3. Increase whole grains in diet. 4. Daily exercise for 20-30 minutes daily.   Navigation:  Patient is aware of 1 more health coaching sessions and a follow up. Patient declined referral to PCP at this time.   Time- 13 minutes

## 2023-09-11 ENCOUNTER — Ambulatory Visit: Payer: No Typology Code available for payment source | Admitting: Internal Medicine

## 2023-10-10 ENCOUNTER — Ambulatory Visit
Admission: RE | Admit: 2023-10-10 | Discharge: 2023-10-10 | Disposition: A | Discharge status: Home / Self Care | Payer: No Typology Code available for payment source | Source: Ambulatory Visit | Attending: Obstetrics and Gynecology | Admitting: Obstetrics and Gynecology

## 2023-10-10 ENCOUNTER — Ambulatory Visit: Payer: Self-pay | Admitting: *Deleted

## 2023-10-10 ENCOUNTER — Other Ambulatory Visit: Payer: Self-pay | Admitting: Obstetrics and Gynecology

## 2023-10-10 VITALS — BP 115/77 | Wt 167.0 lb

## 2023-10-10 DIAGNOSIS — Z1239 Encounter for other screening for malignant neoplasm of breast: Secondary | ICD-10-CM

## 2023-10-10 DIAGNOSIS — Z1211 Encounter for screening for malignant neoplasm of colon: Secondary | ICD-10-CM

## 2023-10-10 DIAGNOSIS — Z1231 Encounter for screening mammogram for malignant neoplasm of breast: Secondary | ICD-10-CM

## 2023-10-10 NOTE — Patient Instructions (Signed)
Explained breast self awareness with [name]. Patient did not need a Pap smear today due to last Pap smear and HPV typing was in 07/24/2022. Let her know B[facility]CP will cover Pap smears and HPV typing every 5 years unless has a history of abnormal Pap smears. Referred patient to the [facility] for a screening mammogram on mobile unit. Appointment scheduled Thursday, October 10, 2023 at 1610. Patient aware of appointment and will be there. Let patient know the Breast Center will follow up with her within the next couple weeks with results of mammogram by letter or phone. Caroline Carter verbalized understanding.  Caroline Carter, Kathaleen Maser, RN 3:45 PM

## 2023-10-10 NOTE — Progress Notes (Signed)
Caroline Carter is a 48 y.o. female who presents to [facility] clinic today with no complaints.    Pap Smear: Pap smear not completed today. Last Pap smear was 07/24/2022 at [facility] clinic and was normal with negative HPV. Per patient has no history of an abnormal Pap smear. Last Pap smear result is available in Epic.   Physical exam: Breasts Breasts symmetrical. No skin abnormalities bilateral breasts. No nipple retraction bilateral breasts. No nipple discharge bilateral breasts. No lymphadenopathy. No lumps palpated bilateral breasts. No complaints of pain or tenderness on exam.   MS DIGITAL SCREENING TOMO BILATERAL Result Date: 07/26/2022 CLINICAL DATA:  Screening. EXAM: DIGITAL SCREENING BILATERAL MAMMOGRAM WITH TOMOSYNTHESIS AND CAD TECHNIQUE: Bilateral screening digital craniocaudal and mediolateral oblique mammograms were obtained. Bilateral screening digital breast tomosynthesis was performed. The images were evaluated with computer-aided detection. COMPARISON:  Previous exam(s). ACR Breast Density Category c: The breast tissue is heterogeneously dense, which may obscure small masses. FINDINGS: There are no findings suspicious for malignancy. IMPRESSION: No mammographic evidence of malignancy. A result letter of this screening mammogram will be mailed directly to the patient. RECOMMENDATION: Screening mammogram in one year. (Code:SM-B-01Y) BI-RADS CATEGORY  1: Negative. Electronically Signed   By: Amie Portland M.D.   On: 07/26/2022 15:07   MM 3D SCREEN BREAST BILATERAL Result Date: 07/01/2021 CLINICAL DATA:  Screening. EXAM: DIGITAL SCREENING BILATERAL MAMMOGRAM WITH TOMOSYNTHESIS AND CAD TECHNIQUE: Bilateral screening digital craniocaudal and mediolateral oblique mammograms were obtained. Bilateral screening digital breast tomosynthesis was performed. The images were evaluated with computer-aided detection. COMPARISON:  Previous exam(s). ACR Breast Density Category c: The breast tissue is  heterogeneously dense, which may obscure small masses. FINDINGS: There are no findings suspicious for malignancy. IMPRESSION: No mammographic evidence of malignancy. A result letter of this screening mammogram will be mailed directly to the patient. RECOMMENDATION: Screening mammogram in one year. (Code:SM-B-01Y) BI-RADS CATEGORY  1: Negative. Electronically Signed   By: Bary Richard M.D.   On: 07/01/2021 09:05    Pelvic/Bimanual Pap is not indicated today per BCCCP guidelines.   Smoking History: Patient has never smoked.   Patient Navigation: Patient education provided. Access to services provided for patient through [facility] program. Spanish interpreter [name] from [facility] provided.   Colorectal Cancer Screening: Per patient has never had colonoscopy completed. FIT Test completed 04/10/2022 that was negative. FIT Test given to patient today to complete. No complaints today.    Breast and Cervical Cancer Risk Assessment: Patient does not have family history of breast cancer, known genetic mutations, or radiation treatment to the chest before age 35. Patient does not have history of cervical dysplasia, immunocompromised, or DES exposure in-utero.  Risk Scores as of Encounter on 10/10/2023     Caroline Carter           5-year 0.77%   Lifetime 8.67%            Last calculated by Caprice Red, CMA on 10/10/2023 at  3:25 PM        A: B[facility]CP exam without pap smear No complaints.  P: Referred patient to the [facility] for a screening mammogram on mobile unit. Appointment scheduled Thursday, October 10, 2023 at 1610.  Priscille Heidelberg, RN 10/10/2023 3:45 PM

## 2024-04-13 ENCOUNTER — Telehealth: Payer: Self-pay

## 2024-04-13 NOTE — Telephone Encounter (Signed)
 Health Coaching 3  interpreter-Erika Jayne [place]   Goals- Continue practicing a heart healthy diet. Watching the amount of fried and fatty foods consumed. Eating more lean proteins like chicken or fish. Consuming fruits and vegetables daily. Encouraged patient to continue to try and get 20-30 minutes of exercise daily.       Navigation:  Patient is aware of  a follow up session. Patient is scheduled for FU on 06/08/24.

## 2024-06-05 NOTE — Progress Notes (Signed)
 Wisewoman follow up   Interpreter: Bernice Angry, [employer]  Clinical Measurement:   Vitals:   06/08/24 1056 06/08/24 1107  BP: 120/72 108/65      Medical History: Patient states that she does not know if she has high cholesterol, does not have high blood pressure and she does not have diabetes. Patient states that she does not have history of gestational hypertension, does not have history of pre-eclampsia/eclampsia and she has history of gestational diabetes.     Medications: Patient states that she does not take medication to lower cholesterol, blood pressure and blood sugar.  Patient does not take an aspirin a day to help prevent a heart attack or stroke.    Blood pressure, self measurement: Patient states that she does not measure blood pressure from home. She checks her blood pressure N/A. She shares her readings with a health care provider: N/A.   Nutrition: Patient states that on average she eats 2 cups of fruit and 0 cups of vegetables per day. Patient states that she does not eat fish at least 2 times per week. Patient eats less than half servings of whole grains. Patient drinks less than 36 ounces of beverages with added sugar weekly: yes. Patient is currently watching sodium or salt intake: yes. In the past 7 days patient has had 0 drinks containing alcohol. On average patient drinks 0 drinks containing alcohol per day.      Physical activity: Patient states that she gets 60 minutes of physical activity each week.  Smoking status: Patient states that she has has never smoked .   Quality of life: Over the past 2 weeks patient states that she had little interest or pleasure in doing things: not at all. She has been feeling down, depressed or hopeless:not at all.   Social Determinants of Health Assessment:   Computer Use: During the last 12 months patient states that she has used any of the following: desktop/laptop, smart phone or tablet/other portable wireless computer: yes.    Internet Use: During the last 12 months, did you or any member of your household have access to the internet: Yes, by paying a cell phone company or internet service provider.   Food Insecurities: During the last 12 months, where there any times when you were worried that you would run out of food because of a lack of money or other resources: No.   Transportation Barriers: During the last 12 months, have you missed a doctor's appointment because of transportation problems: No.   Childcare Barriers: If you are currently using childcare services, please identify  the type of services you use. (If not using childcare services, please select Not applicable): not applicable. During the last 12 months, have you had any barriers to childcare services such as: not applicable.   Housing: What is your housing situation today: I have housing.   Intimate Partner Violence: During the last 12 months, how often did your partner physically hurt you: never. During the last 12 months, how often did your partner insult you or talk down to you: never.  Medication Adherence: During the last 12 months, did you ever forget to take your medicine: not applicable. During the last 12 months, were you careless ar times about taking your medicine: not applicable. During the last 12 months, when you felt better did you sometimes stop taking your medication: not applicable. During the last 12 months, sometimes if you felt worse when you took your medicine did you stop taking it: not applicable.  Risk reduction and counseling:     Navigation: This was the  follow up session for this patient, I will check up on her progress in the coming months.

## 2024-06-08 ENCOUNTER — Inpatient Hospital Stay: Payer: Self-pay | Attending: Obstetrics and Gynecology | Admitting: *Deleted

## 2024-06-08 VITALS — BP 108/65 | Ht 62.0 in | Wt 166.9 lb

## 2024-06-08 DIAGNOSIS — Z0000 Encounter for general adult medical examination without abnormal findings: Secondary | ICD-10-CM

## 2024-08-07 NOTE — Progress Notes (Signed)
 Wisewoman Re-screening   Interpreter- Hadassah Satterfield, [place]   Clinical Measurement:  Vitals:   08/10/24 0840 08/10/24 0853  BP: 125/72 110/72   Fasting Labs Drawn Today, will review with patient when they result.   Medical History: Patient states that she does not know if she has high cholesterol, does not have high blood pressure and she does not have diabetes. Patient states that she does not have history of gestational hypertension, does not have history of pre-eclampsia/eclampsia and she does not have history of gestational diabetes.    Medications: Patient states that she does not take medication to lower cholesterol, blood pressure or blood sugar.  Patient does not take an aspirin a day to help prevent a heart attack or stroke. During the past 7 days patient N/A taken prescribed medication to lower N/A on N/A days.   Blood pressure, self measurement: Patient states that she does not measure blood pressure from home. She checks her blood pressure N/A. She shares her readings with a health care provider: N/A.   Nutrition: Patient states that on average she eats 2 cups of fruit and 1 cups of vegetables per day. Patient states that she does not eat fish at least 2 times per week. Patient eats less than half servings of whole grains. Patient drinks less than 36 ounces of beverages with added sugar weekly: yes. Patient is currently watching sodium or salt intake: yes. In the past 7 days patient has consumed drinks containing alcohol on 0 days. On a day that patient consumes drinks containing alcohol on average 0 drinks are consumed.      Physical activity: Patient states that she gets 70 minutes of physical activity each week.  Smoking status: Patient states that she has has never smoked .   Quality of life: Over the past 2 weeks patient states that she had little interest or pleasure in doing things: not at all. She has been feeling down, depressed or hopeless:not at all.   Social Determinants  of Health Assessment:   Computer Use: During the last 12 months patient states that she has used any of the following: desktop/laptop, smart phone or tablet/other portable wireless computer: yes.   Internet Use: During the last 12 months, did you or any member of your household have access to the internet: Yes, by paying a cell phone company or internet service provider.   Food Insecurities: During the last 12 months, where there any times when you were worried that you would run out of food because of a lack of money or other resources: No.   Transportation Barriers: During the last 12 months, have you missed a doctor's appointment because of transportation problems: No.   Childcare Barriers: If you are currently using childcare services, please identify  the type of services you use. (If not using childcare services, please select Not applicable): not applicable. During the last 12 months, have you had any barriers to childcare services such as: not applicable.   Housing: What is your housing situation today: I have housing.   Intimate Partner Violence: During the last 12 months, how often did your partner physically hurt you: never. During the last 12 months, how often did your partner insult you or talk down to you: never.  Medication Adherence: During the last 12 months, did you ever forget to take your medicine: not applicable. During the last 12 months, were you careless ar times about taking your medicine: not applicable. During the last 12 months, when you felt  better did you sometimes stop taking your medication: not applicable. During the last 12 months, sometimes if you felt worse when you took your medicine did you stop taking it: not applicable.   Risk reduction and counseling:   Health Coaching: Spoke with patient about the daily recommendations for fruits and vegetables. Showed patient what a serving size would look like. Patient usually consumes a serving of fish each week.  Spoke with patient about trying to add an extra serving of fish into her weekly diet. Patient consumes whole wheat bread and whole grain cereals at times. Gave suggestions for other whole grains that she can try incorporating into her diet (whole grain bread, brown rice or whole wheat pasta). Patient has been walking daily for 10-20 minutes. Encouraged her to continue with daily exercise.  Goal: Patient will add an extra serving of heart healthy fish into her weekly diet. Patient will work on reaching this goal over the next month.   Navigation:  I will notify patient of lab results.  Patient is aware of 2 more health coaching sessions and a follow up.

## 2024-08-10 ENCOUNTER — Inpatient Hospital Stay: Attending: Obstetrics and Gynecology | Admitting: *Deleted

## 2024-08-10 ENCOUNTER — Other Ambulatory Visit: Payer: Self-pay

## 2024-08-10 VITALS — BP 110/72 | Ht 62.0 in | Wt 165.4 lb

## 2024-08-10 DIAGNOSIS — Z0000 Encounter for general adult medical examination without abnormal findings: Secondary | ICD-10-CM

## 2024-08-11 LAB — LIPID PANEL
Chol/HDL Ratio: 3.4 ratio (ref 0.0–4.4)
Cholesterol, Total: 207 mg/dL — ABNORMAL HIGH (ref 100–199)
HDL: 61 mg/dL (ref >39)
LDL Chol Calc (NIH): 127 mg/dL — ABNORMAL HIGH (ref 0–99)
Triglycerides: 108 mg/dL (ref 0–149)
VLDL Cholesterol Cal: 19 mg/dL (ref 5–40)

## 2024-08-11 LAB — GLUCOSE, RANDOM: Glucose: 88 mg/dL (ref 70–99)

## 2024-08-11 LAB — HEMOGLOBIN A1C
Est. average glucose Bld gHb Est-mCnc: 105 mg/dL
Hgb A1c MFr Bld: 5.3 % (ref 4.8–5.6)

## 2024-08-17 ENCOUNTER — Ambulatory Visit: Payer: Self-pay

## 2024-08-17 NOTE — Telephone Encounter (Signed)
 Health coaching 2   interpreter- [employer] [phone]   Labs- 207 cholesterol, 127 LDL cholesterol, 61 HDL cholesterol, 108 triglycerides, 5.3 hemoglobin A1C, 88 mean plasma glucose. Patient understands and is aware of her lab results.   Goals-  1. Reduce the amount of fried and fatty foods consumed. Try to grill, broil, bake, air fry or sautee foods with a small amount of olive oil. 2. Reduce the amount of red meats consumed. Increase the amount of lean proteins consumed like chicken, fish and turkey.  3. Reduce the amount of whole fat dairy products consumed. Look for low fat or reduced fat options instead. 4. Increase whole grain food intake. 5. Daily exercise for 20-30 minutes.    Navigation:  Patient is aware of 1 more health coaching sessions and a follow up. Patient is scheduled for FU with Internal Medicine on Wednesday, August 26, 2024 @ 1:15 pm for elevated labs.

## 2024-08-26 ENCOUNTER — Ambulatory Visit

## 2024-09-01 ENCOUNTER — Other Ambulatory Visit: Payer: Self-pay

## 2024-09-01 ENCOUNTER — Ambulatory Visit: Payer: Self-pay

## 2024-09-01 VITALS — BP 121/77 | HR 71 | Temp 97.8°F | Ht 62.0 in | Wt 167.2 lb

## 2024-09-01 DIAGNOSIS — M25512 Pain in left shoulder: Secondary | ICD-10-CM

## 2024-09-01 DIAGNOSIS — E785 Hyperlipidemia, unspecified: Secondary | ICD-10-CM

## 2024-09-01 MED ORDER — METHOCARBAMOL 750 MG PO TABS
750.0000 mg | ORAL_TABLET | Freq: Four times a day (QID) | ORAL | 0 refills | Status: AC
  Filled 2024-09-01: qty 56, 14d supply, fill #0

## 2024-09-01 NOTE — Progress Notes (Signed)
 CC: Lipid Panel HPI:  [name] is a 48 y.o. female living with a history stated below and presents today after being referred by [facility] for lipid panel results. Please see problem based assessment and plan for additional details.  Past Medical History:  Diagnosis Date   Allergy    Anxiety    Asthma    Depression     Current Outpatient Medications on File Prior to Visit  Medication Sig Dispense Refill   ibuprofen (ADVIL) 800 MG tablet      No current facility-administered medications on file prior to visit.    Family History  Problem Relation Age of Onset   Diabetes Maternal Grandmother    Hypertension Maternal Grandmother    Breast cancer Neg Hx     Social History   Socioeconomic History   Marital status: Single    Spouse name: Not on file   Number of children: 2   Years of education: Not on file   Highest education level: Never attended school  Occupational History   Not on file  Tobacco Use   Smoking status: Never   Smokeless tobacco: Never  Vaping Use   Vaping status: Never Used  Substance and Sexual Activity   Alcohol use: No   Drug use: No   Sexual activity: Yes    Birth control/protection: Surgical  Other Topics Concern   Not on file  Social History Narrative   Pt was born in [place]   Social Drivers of Health   Financial Resource Strain: Not on file  Food Insecurity: No Food Insecurity (10/10/2023)   Hunger Vital Sign    Worried About Running Out of Food in the Last Year: Never true    Ran Out of Food in the Last Year: Never true  Transportation Needs: No Transportation Needs (10/10/2023)   PRAPARE - Administrator, Civil Service (Medical): No    Lack of Transportation (Non-Medical): No  Physical Activity: Not on file  Stress: Not on file  Social Connections: Unknown (02/06/2022)   Received from [facility]   Social Network    Social Network: Not on file  Intimate Partner Violence: Unknown (12/29/2021)    Received from [facility]   HITS    Physically Hurt: Not on file    Insult or Talk Down To: Not on file    Threaten Physical Harm: Not on file    Scream or Curse: Not on file    Review of Systems: ROS  Per HPI, assessment, plan Vitals:   09/01/24 1407  BP: 121/77  Pulse: 71  Temp: 97.8 F (36.6 C)  TempSrc: Oral  SpO2: 96%  Weight: 167 lb 3.2 oz (75.8 kg)  Height: 5' 2 (1.575 m)    Physical Exam: Physical Exam HENT:     Head: Normocephalic.  Cardiovascular:     Rate and Rhythm: Normal rate and regular rhythm.  Musculoskeletal:     Comments: Restricted active and passive range of motion of L shoulder. Active and passive range of motion intact in R shoulder. Positive painful arc test on the left side Positive Neer's test on the left side Positive empty can test on the left side Pain in the left side of the neck during Spurling's maneuver  Neurological:     Mental Status: She is alert.      Assessment & Plan:     Patient seen with Dr. CHARLENA Eastern  Assessment & Plan Acute pain of left shoulder Patient reports  she has been experiencing left shoulder pain for the past 4 months.  She does not have pain when her elbow is supported, however, when she leaves her hand down she starts experiencing the pain again in her left shoulder.  The pain also occurs when she sleeps on the left side.  She has also developed numbness in her entire left arm that occurs only at night.  Patient reports no swelling, erythema, tingling, pinprick sensation in her shoulder or her arm.  She denies any fevers, chills.  Mentions that the pain also radiates up to her left side of the neck and stops close to her ear.  She has been managing her symptoms with free, once a week PT sessions at her daughter's [facility] for this past month.  She says physical therapy has helped her symptoms.  She also takes ibuprofen 250 mg as needed for the pain.  Reports the pain is 6 out of 10, it is not  constant.  Patient did not have any trauma in the region.  Reports that she had rotator cuff tendinopathy on the right shoulder for which she underwent PT sessions for a year.  Currently all symptoms in the right shoulder have resolved.  Patient's physical exam was positive for painful arc test, Neer's test, empty can test on the left side.  On performing daily Spurling's maneuver, patient experienced pain in her left neck.  Do think her symptoms are positive for left sided rotator cuff tendinopathy but could also be  due to some nerve root compression in the cervical region.  Informed patient about steroid injections to which she declined today.  Asked patient to continue with PT sessions, ibuprofen for not more than 14 days if taken continuously, and added Tylenol as needed.  Will start her on Robaxin  for further symptom control. - Robaxin  750 mg 3 times daily for 2 weeks -Continue PT, ibuprofen as needed -Tylenol as needed for additional pain control  Hyperlipidemia, unspecified hyperlipidemia type Patient's lipid panel from 04/09/2024 showed elevated total cholesterol and elevated LDL of 127.  Her LDL levels for the past 2 years have been elevated as well.  Her ASCVD score is less than 5%, therefore, did not require treatment with statin.  Informed patient that her BMI has slightly gone up in 1 year which has placed her in the obese category and that it was important for her to lose weight which will also help managing her cholesterol levels. Discussed with her to manage her high cholesterol levels with lifestyle modifications like healthy diet and increased exercise which will also help her lose weight.  Patient reports she has already started using olive oil in her meals.  Discussed with her the importance of incorporating lean meat like chicken, turkey, fish in her diet and to lower her intake of red meat.  Counseled patient on incorporating whole grains and trying out Mediterranean diet if that is  helpful for her.  Patient reports she walks on the treadmill for 20 minutes 3 times weekly and has been doing this for the past 3 months.  She also reports walking outside when the weather is pleasant but has not been doing now with the cold.  Counseled patient on increasing her exercise to at least 150 minutes of moderate activity weekly.  - Controlling cholesterol levels with lifestyle modifications -Information about Mediterranean diet and exercise for losing weight provided in AVS      No orders of the defined types were placed in this  encounter.    Rebecka Pion, D.O. [facility], PGY-1 Date 09/01/2024 Time 7:17 PM

## 2024-09-01 NOTE — Patient Instructions (Addendum)
 Por favor, siga las instrucciones tal como se indicaron en el plan de hoy: - Tome una tableta de Robaxin  de 750 mg por va oral, 3 veces al da, para engineer, materials en el hombro izquierdo durante 2 semanas. - Adems, puede tomar ibuprofeno o Tylenol segn sea necesario para el dolor en el hombro izquierdo. Sin embargo, no los tome de forma continua durante ms de 2 semanas, ya que pueden daar los riones. - Contine con sus sesiones de fisioterapia para ayudar a engineer, materials en el hombro izquierdo. - Intente incorporar comidas saludables y ejercicio, como se coment durante la [place], para ayudar a chief operating officer sus niveles de colesterol. En el resumen de la visita encontrar informacin adicional sobre la dieta mediterrnea y el ejercicio.  Caroline Trenia Rebecka Edgardo, Caroline Carter     Please follow instructions as discussed in today's plan: - Please take Robaxin  750 mg tablet: 1 tablet by mouth 3 times a day to help with left shoulder pain for 2 weeks.  -Additionally, you can take ibuprofen or Tylenol as needed for your left shoulder pain.  However, Caroline Carter not take it continuously for more than 2 weeks as it can cause harm to your kidneys. -Please continue with your physical therapy sessions to help with the left shoulder pain. -Please try to incorporate healthy meals and exercise as discussed during our visit to help control your cholesterol levels.  Additional information on Mediterranean diet and exercise is provided with your after visit summary.  Thank You  Sincerely, Rebecka Edgardo, Caroline Carter

## 2024-09-01 NOTE — Assessment & Plan Note (Signed)
 Patient's lipid panel from 04/09/2024 showed elevated total cholesterol and elevated LDL of 127.  Her LDL levels for the past 2 years have been elevated as well.  Her ASCVD score is less than 5%, therefore, did not require treatment with statin.  Informed patient that her BMI has slightly gone up in 1 year which has placed her in the obese category and that it was important for her to lose weight which will also help managing her cholesterol levels. Discussed with her to manage her high cholesterol levels with lifestyle modifications like healthy diet and increased exercise which will also help her lose weight.  Patient reports she has already started using olive oil in her meals.  Discussed with her the importance of incorporating lean meat like chicken, turkey, fish in her diet and to lower her intake of red meat.  Counseled patient on incorporating whole grains and trying out Mediterranean diet if that is helpful for her.  Patient reports she walks on the treadmill for 20 minutes 3 times weekly and has been doing this for the past 3 months.  She also reports walking outside when the weather is pleasant but has not been doing now with the cold.  Counseled patient on increasing her exercise to at least 150 minutes of moderate activity weekly.  - Controlling cholesterol levels with lifestyle modifications -Information about Mediterranean diet and exercise for losing weight provided in AVS

## 2024-09-07 NOTE — Progress Notes (Signed)
 Internal Medicine Clinic Attending  Case discussed with the resident at the time of the visit.  We reviewed the resident's history and exam and pertinent patient test results.  I agree with the assessment, diagnosis, and plan of care documented in the resident's note.
